# Patient Record
Sex: Female | Born: 1970 | Race: White | Hispanic: No | Marital: Married | Smoking: Never smoker
Health system: Southern US, Community
[De-identification: ages and names within clinical notes are randomized; demographics above are authoritative.]

## PROBLEM LIST (undated history)

## (undated) DIAGNOSIS — E119 Type 2 diabetes mellitus without complications: Secondary | ICD-10-CM

## (undated) DIAGNOSIS — I639 Cerebral infarction, unspecified: Secondary | ICD-10-CM

## (undated) DIAGNOSIS — I10 Essential (primary) hypertension: Secondary | ICD-10-CM

## (undated) DIAGNOSIS — E7800 Pure hypercholesterolemia, unspecified: Secondary | ICD-10-CM

## (undated) DIAGNOSIS — F0390 Unspecified dementia without behavioral disturbance: Secondary | ICD-10-CM

## (undated) DIAGNOSIS — E079 Disorder of thyroid, unspecified: Secondary | ICD-10-CM

## (undated) HISTORY — PX: APPENDECTOMY: SHX54

---

## 1998-09-12 ENCOUNTER — Other Ambulatory Visit: Admission: RE | Admit: 1998-09-12 | Discharge: 1998-09-12 | Payer: Self-pay | Admitting: Obstetrics and Gynecology

## 1998-12-27 ENCOUNTER — Encounter: Admission: RE | Admit: 1998-12-27 | Discharge: 1999-03-27 | Payer: Self-pay | Admitting: Endocrinology

## 2000-03-04 ENCOUNTER — Other Ambulatory Visit: Admission: RE | Admit: 2000-03-04 | Discharge: 2000-03-04 | Payer: Self-pay | Admitting: Obstetrics and Gynecology

## 2000-10-01 ENCOUNTER — Other Ambulatory Visit: Admission: RE | Admit: 2000-10-01 | Discharge: 2000-10-01 | Payer: Self-pay | Admitting: Obstetrics and Gynecology

## 2001-03-02 ENCOUNTER — Emergency Department (HOSPITAL_COMMUNITY): Admission: EM | Admit: 2001-03-02 | Discharge: 2001-03-02 | Payer: Self-pay | Admitting: Emergency Medicine

## 2001-04-22 ENCOUNTER — Other Ambulatory Visit: Admission: RE | Admit: 2001-04-22 | Discharge: 2001-04-22 | Payer: Self-pay | Admitting: Obstetrics and Gynecology

## 2001-08-12 ENCOUNTER — Ambulatory Visit (HOSPITAL_COMMUNITY): Admission: RE | Admit: 2001-08-12 | Discharge: 2001-08-13 | Payer: Self-pay | Admitting: Ophthalmology

## 2001-08-12 ENCOUNTER — Encounter: Payer: Self-pay | Admitting: Ophthalmology

## 2002-06-01 ENCOUNTER — Other Ambulatory Visit: Admission: RE | Admit: 2002-06-01 | Discharge: 2002-06-01 | Payer: Self-pay | Admitting: Obstetrics and Gynecology

## 2003-09-20 ENCOUNTER — Inpatient Hospital Stay (HOSPITAL_COMMUNITY): Admission: EM | Admit: 2003-09-20 | Discharge: 2003-09-23 | Payer: Self-pay | Admitting: Emergency Medicine

## 2003-12-01 ENCOUNTER — Other Ambulatory Visit: Admission: RE | Admit: 2003-12-01 | Discharge: 2003-12-01 | Payer: Self-pay | Admitting: Obstetrics and Gynecology

## 2004-08-03 ENCOUNTER — Inpatient Hospital Stay (HOSPITAL_COMMUNITY): Admission: EM | Admit: 2004-08-03 | Discharge: 2004-08-05 | Payer: Self-pay | Admitting: *Deleted

## 2004-11-28 ENCOUNTER — Encounter: Admission: RE | Admit: 2004-11-28 | Discharge: 2004-11-28 | Payer: Self-pay | Admitting: Gastroenterology

## 2005-05-08 ENCOUNTER — Ambulatory Visit (HOSPITAL_COMMUNITY): Admission: RE | Admit: 2005-05-08 | Discharge: 2005-05-09 | Payer: Self-pay | Admitting: Ophthalmology

## 2009-02-18 ENCOUNTER — Inpatient Hospital Stay (HOSPITAL_COMMUNITY): Admission: AD | Admit: 2009-02-18 | Discharge: 2009-02-18 | Payer: Self-pay | Admitting: Obstetrics

## 2009-02-20 ENCOUNTER — Encounter (INDEPENDENT_AMBULATORY_CARE_PROVIDER_SITE_OTHER): Payer: Self-pay | Admitting: Obstetrics and Gynecology

## 2009-02-20 ENCOUNTER — Ambulatory Visit (HOSPITAL_COMMUNITY): Admission: AD | Admit: 2009-02-20 | Discharge: 2009-02-21 | Payer: Self-pay | Admitting: Obstetrics and Gynecology

## 2010-06-14 ENCOUNTER — Encounter: Admission: RE | Admit: 2010-06-14 | Discharge: 2010-07-04 | Payer: Self-pay | Admitting: Obstetrics and Gynecology

## 2010-07-03 ENCOUNTER — Ambulatory Visit (HOSPITAL_COMMUNITY): Admission: RE | Admit: 2010-07-03 | Discharge: 2010-07-03 | Payer: Self-pay | Admitting: Obstetrics and Gynecology

## 2010-07-17 ENCOUNTER — Ambulatory Visit (HOSPITAL_COMMUNITY): Admission: RE | Admit: 2010-07-17 | Discharge: 2010-07-17 | Payer: Self-pay | Admitting: Obstetrics and Gynecology

## 2010-07-31 ENCOUNTER — Ambulatory Visit (HOSPITAL_COMMUNITY): Admission: RE | Admit: 2010-07-31 | Discharge: 2010-07-31 | Payer: Self-pay | Admitting: Obstetrics and Gynecology

## 2010-08-05 ENCOUNTER — Ambulatory Visit (HOSPITAL_COMMUNITY): Admission: RE | Admit: 2010-08-05 | Discharge: 2010-08-05 | Payer: Self-pay | Admitting: Obstetrics and Gynecology

## 2010-08-07 ENCOUNTER — Ambulatory Visit (HOSPITAL_COMMUNITY): Admission: RE | Admit: 2010-08-07 | Discharge: 2010-08-07 | Payer: Self-pay | Admitting: Obstetrics and Gynecology

## 2010-12-17 LAB — GLUCOSE, CAPILLARY
Glucose-Capillary: 107 mg/dL — ABNORMAL HIGH (ref 70–99)
Glucose-Capillary: 148 mg/dL — ABNORMAL HIGH (ref 70–99)
Glucose-Capillary: 49 mg/dL — ABNORMAL LOW (ref 70–99)

## 2010-12-17 LAB — CBC
MCH: 29.6 pg (ref 26.0–34.0)
MCHC: 34.8 g/dL (ref 30.0–36.0)
MCV: 84.9 fL (ref 78.0–100.0)
Platelets: 267 10*3/uL (ref 150–400)

## 2010-12-17 LAB — TYPE AND SCREEN: Antibody Screen: NEGATIVE

## 2010-12-17 LAB — COMPREHENSIVE METABOLIC PANEL
AST: 46 U/L — ABNORMAL HIGH (ref 0–37)
Albumin: 2.3 g/dL — ABNORMAL LOW (ref 3.5–5.2)
BUN: 7 mg/dL (ref 6–23)
Calcium: 8.6 mg/dL (ref 8.4–10.5)
Creatinine, Ser: 0.65 mg/dL (ref 0.4–1.2)
GFR calc Af Amer: >60 mL/min (ref >60)
GFR calc non Af Amer: >60 mL/min (ref >60)

## 2010-12-17 LAB — TISSUE HYBRIDIZATION TO NCBH

## 2011-01-14 LAB — CBC
HCT: 19.2 % — ABNORMAL LOW (ref 36.0–46.0)
HCT: 19.8 % — ABNORMAL LOW (ref 36.0–46.0)
HCT: 26.8 % — ABNORMAL LOW (ref 36.0–46.0)
HCT: 27.6 % — ABNORMAL LOW (ref 36.0–46.0)
Hemoglobin: 6.6 g/dL — CL (ref 12.0–15.0)
Hemoglobin: 9.6 g/dL — ABNORMAL LOW (ref 12.0–15.0)
MCHC: 34.4 g/dL (ref 30.0–36.0)
MCHC: 34.8 g/dL (ref 30.0–36.0)
MCHC: 35.8 g/dL (ref 30.0–36.0)
MCV: 83.5 fL (ref 78.0–100.0)
MCV: 84.7 fL (ref 78.0–100.0)
Platelets: 244 10*3/uL (ref 150–400)
Platelets: 283 10*3/uL (ref 150–400)
RBC: 2.3 MIL/uL — ABNORMAL LOW (ref 3.87–5.11)
RBC: 3.31 MIL/uL — ABNORMAL LOW (ref 3.87–5.11)
RDW: 14.8 % (ref 11.5–15.5)
RDW: 15.4 % (ref 11.5–15.5)
RDW: 15.4 % (ref 11.5–15.5)
WBC: 20.6 10*3/uL — ABNORMAL HIGH (ref 4.0–10.5)
WBC: 26 10*3/uL — ABNORMAL HIGH (ref 4.0–10.5)

## 2011-01-14 LAB — GLUCOSE, CAPILLARY
Glucose-Capillary: 22 mg/dL — CL (ref 70–99)
Glucose-Capillary: 259 mg/dL — ABNORMAL HIGH (ref 70–99)
Glucose-Capillary: 261 mg/dL — ABNORMAL HIGH (ref 70–99)
Glucose-Capillary: 31 mg/dL — CL (ref 70–99)
Glucose-Capillary: 32 mg/dL — CL (ref 70–99)
Glucose-Capillary: 330 mg/dL — ABNORMAL HIGH (ref 70–99)
Glucose-Capillary: 35 mg/dL — CL (ref 70–99)
Glucose-Capillary: 35 mg/dL — CL (ref 70–99)
Glucose-Capillary: 35 mg/dL — CL (ref 70–99)
Glucose-Capillary: 43 mg/dL — ABNORMAL LOW (ref 70–99)
Glucose-Capillary: 52 mg/dL — ABNORMAL LOW (ref 70–99)
Glucose-Capillary: 59 mg/dL — ABNORMAL LOW (ref 70–99)
Glucose-Capillary: 75 mg/dL (ref 70–99)
Glucose-Capillary: 78 mg/dL (ref 70–99)

## 2011-01-14 LAB — COMPREHENSIVE METABOLIC PANEL
ALT: 13 U/L (ref 0–35)
ALT: 16 U/L (ref 0–35)
AST: 16 U/L (ref 0–37)
Albumin: 1.5 g/dL — ABNORMAL LOW (ref 3.5–5.2)
Alkaline Phosphatase: 94 U/L (ref 39–117)
Alkaline Phosphatase: 96 U/L (ref 39–117)
BUN: 11 mg/dL (ref 6–23)
BUN: 9 mg/dL (ref 6–23)
CO2: 19 mEq/L (ref 19–32)
Chloride: 106 mEq/L (ref 96–112)
GFR calc Af Amer: >60 mL/min (ref >60)
GFR calc non Af Amer: 59 mL/min — ABNORMAL LOW (ref >60)
Glucose, Bld: 275 mg/dL — ABNORMAL HIGH (ref 70–99)
Potassium: 3.9 mEq/L (ref 3.5–5.1)
Potassium: 4.2 mEq/L (ref 3.5–5.1)
Sodium: 133 mEq/L — ABNORMAL LOW (ref 135–145)
Sodium: 135 mEq/L (ref 135–145)
Total Bilirubin: 0.6 mg/dL (ref 0.3–1.2)
Total Protein: 3.5 g/dL — ABNORMAL LOW (ref 6.0–8.3)
Total Protein: 4.3 g/dL — ABNORMAL LOW (ref 6.0–8.3)

## 2011-01-14 LAB — GLUCOSE, RANDOM: Glucose, Bld: 28 mg/dL — CL (ref 70–99)

## 2011-01-14 LAB — CROSSMATCH

## 2011-01-14 LAB — PREPARE RBC (CROSSMATCH)

## 2011-01-14 LAB — ABO/RH: ABO/RH(D): O POS

## 2011-02-21 NOTE — H&P (Signed)
NAMESHERECE, GAMBRILL NO.:  [id number]   MEDICAL RECORD NO.:  [id number]                   PATIENT TYPE:  INP   LOCATION:  0162                                 FACILITY:  [facility]   PHYSICIAN:  Tera Mater. Evlyn Kanner, M.D.              DATE OF BIRTH:  May 28, 1971   DATE OF ADMISSION:  09/20/2003  DATE OF DISCHARGE:  09/23/2003                                HISTORY & PHYSICAL   Ms. [name] is a 40 year old white female with longstanding type 1 diabetes,  multiple complications, as well as hypertension.  She has been ill since the  day prior to presentation with multiple episode of nausea and vomiting but  no abdominal pain or diarrhea.  Her muscles are now sore from vomiting, but  she has no myalgias of other types.  Her bowels have not moved at all in  about 36 hours.  She has had no headache, dizziness, or discrete neurologic  complaints.  Specifically has had no photophobia or stiff neck.  She ate a  little bit on the day prior to admission but has really had no p.o. intake  since.  Her weight is slightly down with this illness.  She has had no  pulmonary symptoms, no cough, no congestion, no nasal symptoms or sinus  symptoms.  She has had no documented fevers, chills, or sweats.  She has had  no urinary symptoms, dysuria, or hesitancy.  She has had no unusual travel  or exposures.  She is still a bit nauseous at presentation to the emergency  room.  She has been watching her blood sugars, and they have actually done  fairly well.  She has had no blood sugars in excess of 250.  She has had  some minor hypoglycemia.   PAST MEDICAL HISTORY:  1. Type 1 diabetes dating back to the age of 28.  2. Prior retinopathy with vitrectomy.  3. History of nephropathy.  4. Neuropathy.  5. Two episodes of DKA in 1997 and 1998.  6. 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  7. History of hyperlipidemia.  8. History of dysfunctional uterine bleeding.  9.  Appendectomy.  10.      C-section.   SOCIAL HISTORY:  She is married, with 1 natural child and 2 adopted  children.   MEDICATIONS:  1. Lantus 32 at bed.  2. Sliding scale Humalog.  3. Altace 5.  4. Hyzaar 100/25.  5. Oral contraceptives.  6. Lipitor.  7. Lasix.   ALLERGIES:  BACTRIM and CODEINE.   PHYSICAL EXAM:  GENERAL:  Ill-appearing.  VITAL SIGNS:  Blood pressure 116/74, pulse 124 and regular, in a sinus  tachycardia on the monitor, respirations 18, temperature initially 97.1,  rising to 100.3 in the ER, O2 saturations 99%.  HEENT:  Sclerae anicteric.  Extraocular movements are intact.  There is no  nystagmus.  No lid lag is  present.  Oral membranes are still moist.  No  oropharyngeal lesions are present.  Nasopharynx is clear.  Tympanic  membranes are clear.  NECK:  Entirely supple in all directions.  No bruits or JVD are present.  LUNGS:  Clear, without wheezes, rales, or rhonchi.  No Kussmaul pattern is  present.  No accessory muscles in use.  HEART:  Tachycardic and regular, with a soft flow murmur.  No S3 is heard.  ABDOMEN:  Slightly distended, with hypoactive bowel sounds.  No discrete  areas of tenderness.  No masses.  No pulsations are noted.  EXTREMITIES:  Pulses intact with trace of edema.  NEUROLOGIC:  Awake, alert, mentating well.  Speech is clear.  No tremors  present.  SKIN:  No bronzing or pigmentary changes.   White count is 13,900, hemoglobin 13.5, platelets 467,000.  Sodium 133,  potassium 3.8, chloride 95, CO2 19, BUN 41, creatinine 1.7, glucose 233.   In summary, I have a 40 year old white female with known type 1 diabetes  presenting with intractable nausea and vomiting.  There is no evidence of  diabetic ketoacidosis by laboratory or clinical criteria.  Her abdominal  exam is largely unremarkable and does not suggest obstruction or severe  intra-abdominal catastrophe.  Liver function tests do need to be checked.  Blood sugars are actually  dropping towards the lower side, and even though  there are no other supporting evidence of cortisol deficiency we do need to  be concerned about this.  Her sodium, potassium, and blood pressure all are  fine, but the morning cortisol will be checked anyway.  I do not think  cortisol coverage is needed this evening.  With the minor increase in fever  and nausea and vomiting, we will cover with some antibiotics while awaiting  cultures.  She will get hydration, bowel rest, and sliding scale insulin.  Clinically she is not thyrotoxic, but this will be checked as well.  GI will  be reconsulted about her clinical condition.  Serial laboratories will be  checked.                                               Tera Mater. Evlyn Kanner, M.D.    SAS/MEDQ  D:  10/25/2003  T:  10/25/2003  Job:  [id number]

## 2011-02-21 NOTE — Op Note (Signed)
NAMEBRESHAY, ILG               ACCOUNT NO.:  [id number]   MEDICAL RECORD NO.:  [id number]          PATIENT TYPE:  OIB   LOCATION:  2550                         FACILITY:  [facility]   PHYSICIAN:  Beulah Gandy. Ashley Royalty, M.D. DATE OF BIRTH:  [birth year]   DATE OF PROCEDURE:  05/08/2005  DATE OF DISCHARGE:                                 OPERATIVE REPORT   ADMISSION DIAGNOSIS:  Traction retinal detachment, proliferative diabetic  retinopathy, vitreous hemorrhage, right eye.   PROCEDURES:  Pars plana vitrectomy, retinal photocoagulation, gas fluid  exchange, intravitreal Kenalog injection, membrane peel, in the right eye.   SURGEON:  Alan Mulder, M.D.   ASSISTANT:  Rosalie Doctor, MA   ANESTHESIA:  General.   DETAILS:  Usual prep and drape, 25 gauge trocars placed at 8, 10, and 2  o'clock, infusion at 8 o'clock. Provisc placed on the corneal surface. The  pars plana vitrectomy was begun just behind the crystalline lens.  Blood and  vitreous were encountered. These were carefully removed under low suction  and rapid cutting. The vitrectomy was carried down to the macular surface  where membranes extended from the disk superiorly and nasally. These were  engaged with the lighted pick and peeled from their attachments to the  retina. They were trimmed down with the vitreous cutter and cauterized with  the Endo cautery and Endo diathermy.  Additional vitrectomy was carried out  to the vitreous base and scleral depression was used.  Once all vitreous and  traction was removed, the endolaser was positioned in the eye, 878 burns  placed around the retinal periphery with a power of 1000 milliwatts, 1000  microns each and 0.1 seconds each.  Intravitreal Kenalog was injected 0.1 mL  over the macula. A gas fluid exchange approximately 20% was performed. The  trocars were removed from the eye carefully and each wound was tested and  found to be tight. The conjunctiva was rinsed with polymyxin and  gentamicin.  Marcaine was injected around the globe for postop pain, Decadron 10  milligrams was injected to the lower subconjunctival space. Tobradex  ophthalmic ointment, a patch and shield were placed. The closing pressure  was 10 with a Barraquer tonometer.   COMPLICATIONS:  None.   DURATION:  1 hour.   A patch and shield were placed. The patient was awakened, taken to recovery  in satisfactory condition.      JDM/MEDQ  D:  05/08/2005  T:  05/09/2005  Job:  [id number]

## 2011-02-21 NOTE — Discharge Summary (Signed)
NAMECHELAN, HERINGER NO.:  [id number]   MEDICAL RECORD NO.:  [id number]          PATIENT TYPE:  INP   LOCATION:  5007                         FACILITY:  [facility]   PHYSICIAN:  Tera Mater. Evlyn Kanner, M.D. DATE OF BIRTH:  1971/02/18   DATE OF ADMISSION:  08/03/2004  DATE OF DISCHARGE:  08/05/2004                                 DISCHARGE SUMMARY   DISCHARGE DIAGNOSES:  1.  Diabetic ketoacidosis, clinically resolved.  2.  Probable Streptococcus pharyngitis, clinically improved.  3.  Renal insufficiency, resolved.  4.  Hyperlipidemia, stable.  5.  Hypertension, stable.  6.  Depression and neuropathy, stable.   HISTORY OF PRESENT ILLNESS:  Ms. Girtha Rm is a 40 year old, white female with  history of known type 1 since 1977, who presented for evaluation in the  afternoon of August 03, 2004.  She was there with nausea, vomiting and sore  throat and had a mixed picture of metabolic acidosis and diabetic  ketoacidosis.  She was treated with IV insulin via Glucommander, IV fluids  and covered with IV antibiotics.  The patient is now much improved.  She has  been transitioned back to her usual insulin regimen and blood sugar this  morning is 111, CO2 has come up from a low value to normal value at 24 and  creatinine has resolved back down to 0.7.  She has been eating and drinking  well with no nausea or vomiting.  She has had no diarrhea and her bowels are  working well.  Her only remaining symptom is of some mild cough that is  nonproductive.  She has had no fever and her blood pressure is normal.  It  appears that her ketoacidosis has resolved and she is discharged home now in  improved condition.   LABORATORY DATA AND X-RAY FINDINGS:  Laboratory data from this morning with  white count 6700, hemoglobin 10.4, MCV 86.8, platelets 281,000.  Her sodium  this morning is 140, potassium 3.8, chloride 111, CO2 24, BUN 4, creatinine  0.7, glucose 111, calcium 7.7.  Earlier  laboratories at presentation with  sodium 140, potassium 4.4, chloride 100, CO2 9, BUN 31, creatinine 2.3,  glucose 265, calcium 9.7.  Total protein 7.0, albumin 3.3, AST 17, ALT 14,  Alk phos 92, total bilirubin 2.2.  Lipase 29.  Initial white count 12,500,  hemoglobin 13.5, platelets 391,000.  Her electrolytes have remained in good  levels with no significant hypokalemia.  She has had no trouble with her  sodium.  She has overall done extremely well and as noted had resolution.  Her liver function testing that was basically normal except for total  bilirubin came back with a total bilirubin of 0.6.  Serum pregnancy test was  negative.  At presentation, the urinalysis did show large bilirubin greater  than 80% ketones and 100 mg protein.  No infection was noted in that.   EKG was sinus rhythm with some tachycardia, but otherwise unremarkable.  There was an acute abdominal series and chest that were both benign without  significant problems.   HOSPITAL COURSE:  We have a  40 year old, white female with known type 1  diabetes presenting now with diabetic ketoacidosis probably secondary to  pharyngitis.  She is markedly improved and discharged home with  instructions.   DIET:  Follow as before.   SPECIAL INSTRUCTIONS:  No pain management is necessary.   DISCHARGE MEDICATIONS:  1.  Lantus and Novolog as before with 32 units of Lantus.  2.  Cymbalta 60 daily.  3.  Altace 10 daily.  4.  Hyzaar 100/25 daily.  5.  Vytorin 10/80 daily.  6.  Lasix 40 mg daily.  7.  Complete Keflex 500 mg three times a day for an additional 5 days.   FOLLOW UP:  She is to see me in 2-3 weeks.  She is to call if she has  recurrent nausea and vomiting, sugars go up or she has other problems.       SAS/MEDQ  D:  08/05/2004  T:  08/05/2004  Job:  [id number]

## 2011-02-21 NOTE — Discharge Summary (Signed)
NAMEBARBARANN, KELLY NO.:  [id number]   MEDICAL RECORD NO.:  [id number]                   PATIENT TYPE:  INP   LOCATION:  0162                                 FACILITY:  [facility]   PHYSICIAN:  Tera Mater. Evlyn Kanner, M.D.              DATE OF BIRTH:  [birth year]   DATE OF ADMISSION:  09/20/2003  DATE OF DISCHARGE:  09/23/2003                                 DISCHARGE SUMMARY   DISCHARGE DIAGNOSES:  1. Severe diabetic ketoacidosis that developed while in hospital.  2. Intractable nausea and vomiting, resolved.  3. Known type 1 diabetes.  4. Anemia.  5. Nephropathy.   HOSPITAL COURSE:  Ms. Girtha Rm is a 40 year old white female with longstanding  type 1 diabetes dating back to the age of 17 with multiple complications.  She was admitted with nausea and vomiting illness and not able to keep any  food down on June 21, 2003.  At that point there was no evidence of  ketosis, acidosis, or severe diabetic disarray.  Over the first night and  evening, she was ordered to get both Lantus and Humalog. Both are charted as  being given. Unfortunately, however, while in the hospital she went into  very severe DKA with a basically unmeasurable bicarbonate and very high  blood sugars. This is an extremely unusual finding and seems to indicate  missed or inadequate insulin during her hospital time. This is not easy to  document, however.   The patient was ill enough that she was transferred to the intensive care  unit. She got vigorous IV hydration, IV insulin, and several hours later  this had resolved. Her nausea and vomiting obviously worsened with this  problem. Her liver function tests became abnormal and she became clinically  unstable.  On the 17th her diet was slowly reinitiated and the patient made  a good clinical improvement and discharged home in improved condition on  September 23, 2003.   She was covered for infection, although none ever showed up. There  was no  evidence of an acute abdominal event causing the acute acidosis. All  evidence points to an active or insufficient insulin effect during her  hospital time. She was seen in consultation by GI during this  hospitalization and they did do an ultrasound which was unremarkable.   Laboratory data on the 16th while in ketoacidosis, pH dropped to 7.252, PCO2  only 11, PO2 of 125, and bicarbonate 4.7. Initial white count was 13,900 and  rose to 16,500, and back to 7000. Hemoglobin was 13.5 and dropped to 10.6  with hydration. Platelet counts were 462,000 and dropped down to 259,000.  Initial chemistry revealed sodium of 132, potassium 2.8, chloride 95, CO2  19, BUN 41, creatinine 1.7, glucose 233, calcium 10.0. At 4:40 in the  morning, on September 21, 2003, her numbers did deteriorate to a sodium of  137, potassium 5.9, chloride 107, CO2  5, BUN 25, creatinine 1.5, glucose  501.  An albumin was 3.2 with the IV drip. She did have a slightly low  potassium at 3.2 on the 17th. Her chemistries at last check on the 18th  revealed sodium of 138, potassium 3.7, chloride 115, CO2 21, BUN 4,  creatinine 0.6, glucose 95, calcium 7.1.  Liver function testing was normal.  AST and ALT at 20 and 18.  Alkaline phosphatase was up at 127 with repeat of  128. Total bilirubin was up at 2.1, repeat at 2.3, and indirect bilirubin  2.0.  Lactic acid was 3.1 on the 16th, moderate serum ketones noted. TSH was  1.120. In the afternoon of 12:15, the cortisol was 18.7. Urinalysis on the  15th showed 80 mg% ketones, but negative glucose. Blood cultures times two  negative. Electrocardiogram shows sinus rhythm with slightly long QT, but  otherwise unremarkable.  Abdominal ultrasound was done that showed  hepatomegaly. This report is not available for my evaluation.   COMMENTS:  In summary, we have a 40 year old white female with known type 1  diabetes presenting with nausea, vomiting, and illness appears to be  fairly  self-resolving. During her hospital time she developed very severe diabetic  ketoacidosis with basically unmeasurable bicarbonate, blood sugar greater  than 500 requiring all the appropriate treatment as noted above. The patient  fortunately did quite well with all these routine measures.  The exact  etiology of the severe diabetic ketoacidosis is uncertain, but seems to be  due to insufficient or ineffective insulin action. Fortunately, the patient  did well and was discharged home in improved condition. She is to resume her  Lantus and Humalog as before, Hyzaar, Altace, Lipitor, and her oral  contraceptive. Her Lasix was to be resumed as well after 24 hours. She was  also added potassium 10 mEq daily until she saw me in one week.   DIET:  Diabetic as before.   FOLLOW UP:  Further lab testing will be done when she returns. There is no  evidence of cortisol insufficiency or thyroid dysfunction as part of this  deterioration.                                               Tera Mater. Evlyn Kanner, M.D.    SAS/MEDQ  D:  10/25/2003  T:  10/25/2003  Job:  [id number]

## 2011-02-21 NOTE — H&P (Signed)
[name], [name] NO.:  [id number]   MEDICAL RECORD NO.:  [id number]          PATIENT TYPE:  EMS   LOCATION:  MAJO                         FACILITY:  [facility]   PHYSICIAN:  Barry Dienes. Eloise Harman, M.D.DATE OF BIRTH:  09/22/1971   DATE OF ADMISSION:  08/03/2004  DATE OF DISCHARGE:                                HISTORY & PHYSICAL   CHIEF COMPLAINT:  Nausea, vomiting, and sore throat.   HISTORY OF PRESENT ILLNESS:  The patient is a 40 year old white female with  a 1-1/2 day history of sore throat with nausea and multiple episodes of  green to yellow vomiting since yesterday afternoon.  Her last meal was toast  on October 28th in the morning.  She has continued to take her insulin  despite not eating, and her last dose of insulin was last evening.  This was  approximately 12 hours ago.  In the emergency room, she has been given IV  fluids, Phenergan, and Zofran.  Currently, she complains of nausea,  headache, and a sore throat.   PAST MEDICAL HISTORY:  Diabetes mellitus, type 1, since age 54 years, which  has been complicated by retinopathy, hypertension, hyperlipidemia, and  depression.   MEDICATIONS PRIOR TO ADMISSION:  1.  Lantus insulin, 32 units subcu q.h.s.  2.  Novolog 30 units subcutaneous q.a.m.  She reports that she does not take      Novolog at other times during the day.  3.  Cymbalta 60 mg p.o. daily.  4.  Altace 10 mg p.o. daily.  5.  Hyzaar 100/25 one tab p.o. daily.  6.  Vytorin 10/80 one tab p.o. daily.  7.  Lasix 40 mg p.o. daily.   ALLERGIES:  1.  CODEINE.  2.  SULFA DRUGS.   PAST SURGICAL HISTORY:  Cesarean section.   FAMILY HISTORY:  No close family members with diabetes mellitus, colon  cancer, or breast cancer.   SOCIAL HISTORY:  She is married and has two children.  She has no history of  tobacco or alcohol abuse.   REVIEW OF SYSTEMS:  Significant for having bifrontal headaches with tactile  temperatures over the past two days.   She has also had a moderately severe  sore throat.  She denies stiff neck and denies shortness of breath,  productive cough, or chest pain.  She has had nausea and vomiting for the  past two days, but denies constipation, dysuria, or frequency.   INITIAL PHYSICAL EXAMINATION:  VITAL SIGNS:  Blood pressure 135/49, pulse  98, respirations 18, temperature 98, pulse oxygen saturation 100% on room  air.  GENERAL:  She is a mildly overweight, white female, who had nausea and  looked quite fatigued.  SKIN:  Flushed.  HEENT: Significant for a very dry throat with erythema with in the posterior  oropharynx.  Ear canals and tympanic membranes were normal.  NECK:  Supple and without jugular venous distention, carotid bruit, or  cervical lymphadenopathy.  CHEST:  Clear to auscultation.  HEART:  Regular rate and rhythm without murmur, gallop, or rub.  ABDOMEN:  Normal bowel sounds with no  hepatosplenomegaly or tenderness.  EXTREMITIES:  Without cyanosis, clubbing, or edema.  NEUROLOGIC:  Exam is nonfocal, and she has intact light touch sensation in  her feet.   LABORATORY STUDIES:  White blood cell count 12.6, hemoglobin 13, hematocrit  39, platelet count 391, serum sodium 140, potassium 4.4, chloride 100, CO2  9, BUN 31, creatinine 2.3, glucose 265, total protein 7, albumin 3.3,  urine  pregnancy test negative, alkaline phosphatase 92, total bilirubin 2.2.  Venous blood gas:  pH 7.39, total CO2 15.  Urine ketones greater than 80,  nitrite negative, bacteria rare.   IMPRESSION/PLAN:  1.  Diabetic ketoacidosis:  This is moderately severe.  Her pH is in the      normal range because she has confined metabolic acidosis from diabetic      ketoacidosis and metabolic alkalosis from severe vomiting.  I plan on      continuing intravenous fluid rehydration and placing her on intravenous      insulin via the computerized Glucommander.  2.  Sore throat:  It is likely secondary to strep pharyngitis and  may be the      factor that led her to develop diabetic ketoacidosis.  I plan to give      her a dose of Rocephin today and initiate treatment with Keflex when her      sore throat is better and she is eating better.  3.  Elevated serum creatinine:  It is unclear if this is her baseline serum      creatinine level due to chronic renal insufficiency or if it is elevated      today due to dehydration from her nausea and vomiting.  I plan to      continue intravenous fluids and check serial BMET tests.  We will review      office notes when they are available.       DGP/MEDQ  D:  08/03/2004  T:  08/03/2004  Job:  [id number]   cc:   Jeannett Senior A. Evlyn Kanner, M.D.  [address]  [place]  [place] [address]  Fax: [phone]

## 2011-02-21 NOTE — Consult Note (Signed)
[name], [name] NO.:  [id number]   MEDICAL RECORD NO.:  [id number]                   PATIENT TYPE:  INP   LOCATION:  0375                                 FACILITY:  [facility]   PHYSICIAN:  Bernette Redbird, M.D.                DATE OF BIRTH:  Nov 10, 1970   DATE OF CONSULTATION:  09/21/2003  DATE OF DISCHARGE:                                   CONSULTATION   REASON FOR CONSULTATION:  Dr. Evlyn Kanner asked me to see this 40 year old female  because of protracted nausea and vomiting.   [name] was admitted to the hospital about 12 hours ago with a roughly 24  hour history of recurrent nonbloody emesis with associated nausea.  The  patient has previously had nausea and vomiting in association with episodes  of DKA but DKA was not an issue on the current admission. She has not had  abdominal pain.  She has not had fevers or chills.   The patient recalls a similar episode several years ago which was treated  with intravenous fluids and did not require hospitalization.  The current  symptoms began after lunch two days ago while at work but she does not  suspect any food poisoning.   The patient has longstanding diabetes, basically since age 63, and has  multiple end-organ complications including neuropathy, neuropathy etc. but  up to the present time there has never been clinically-evident GI autonomic  neuropathy.  She does not have symptoms of chronic gastroparesis such as  bloating or food intolerance.   PAST MEDICAL HISTORY:  Allergies to CODEINE and SULFA.   OUTPATIENT MEDICATIONS:  1. Altace.  2. Birth control pills.  3. Lasix.  4. Lipitor.  5. Hyzaar.  6. Lantus.  7. Novolog insulin.   OPERATION:  Remote appendectomy and cesarean section.   CHRONIC MEDICAL ILLNESSES:  1. Severe type 1 diabetes of 25 years duration.  2. Hypertension.  3. Hypercholesterolemia.  4. No known MI or pulmonary disease.   FAMILY HISTORY:  Not obtained in detail, not  felt relevant.   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   REVIEW OF SYMPTOMS:  See above.  Really no chronic symptoms such as reflux,  abdominal pain, or bowel problems such as constipation or diarrhea.   HABITS:  Nonsmoker, nondrinker.   PHYSICAL EXAMINATION:  VITAL SIGNS:  On admission, the patient had a  temperature of 101.1 and white count was slightly elevated at 13,900.  GENERAL:  A pleasant, overweight, rather quiet Caucasian female in no  evident distress. At the time of my evaluation, she is perhaps slightly  somnolent from medication.  HEENT:  Anicteric, no frank pallor.  CHEST:  Clear.  HEART:  Has a 2/6 systolic ejection murmur at the upper left sternal border.  ABDOMEN:  At this time is quiet but benign, soft, without organomegaly,  guarding, mass, tenderness or _________.  LABORATORY DATA:  Urine pregnancy test negative, TSH normal.  Liver  chemistries show mild unconjugated hyperbilirubinemia with a total bilirubin  of 2.1, of which 2.0 is indirect.  The alkaline phosphatase minimally  elevated at 127 (normal up to 117).  AST and ALT normal.  Admission white  count 13,900, hemoglobin 13.5, platelets 462,000, 81 polys, 14 lymphs.  Metabolic panel showed a bicarb of 19 with a glucose of 233, BUN 41,  creatinine 1.7 and liver chemistries as noted above.   IMPRESSION:  1. Protracted nausea and vomiting.  2. Admitting low grade fever and mild leukocytosis of uncertain clinical     significance and uncertain relevance to presenting symptoms.  3. Gilbert's syndrome (mild unconjugated bilirubinemia).  4. Longstanding diabetes with multiple end-organ complications.  5. Mild azotemia consistent with prerenal etiology.   RECOMMENDATIONS:  1. Supportive care with IV fluids and symptomatic control of nausea.  2. Follow fever and white count with consideration for antibiotic therapy     depending on the patient's clinical evolution.  3. Send stool for occult  blood.  4. If the patient's symptoms persist over the next 24 hours or so, consider     endoscopic evaluation.  5. Empiric proton pump inhibitor therapy.  6. Consider intravenous Reglan if dysphagia symptoms persist.  7. Consider possible eventual gastric emptying scan to look for evidence of     gastric dysmotility.                                               Bernette Redbird, M.D.    RB/MEDQ  D:  09/21/2003  T:  09/21/2003  Job:  [id number]   cc:   Jeannett Senior A. Evlyn Kanner, M.D.  [address]  [place]  [place] [address]  Fax: [phone]

## 2011-02-21 NOTE — Op Note (Signed)
[facility]  Patient:    [name], [name] Visit Number: [id number] MRN: [id number]          Service Type: DSU Location: [phone] Attending Physician:  Bertrum Sol Dictated by:   Beulah Gandy. Ashley Royalty, M.D. Proc. Date: 08/12/01 Admit Date:  08/12/2001 Discharge Date: 08/13/2001                             Operative Report  DATE OF BIRTH:  [birth year]  PREOPERATIVE DIAGNOSES: 1. Proliferative diabetic retinopathy, right eye. 2. Proliferative diabetic retinopathy, left eye with vitreous    hemorrhage and traction retinal detachment.  POSTOPERATIVE DIAGNOSES: 1. Proliferative diabetic retinopathy, right eye. 2. Proliferative diabetic retinopathy, left eye with vitreous    hemorrhage and traction retinal detachment.  OPERATION: 1. Panretinal photocoagulation right eye. 2. Pars plana vitrectomy with membrane peel and retinal    photocoagulation, left eye.  SURGEON:  Beulah Gandy. Ashley Royalty, M.D.  ASSISTANT:  Winfred Burn, C.O.A.  ANESTHESIA:  General.  DESCRIPTION OF PROCEDURE:  After proper anesthesia was induced, panretinal photocoagulation was performed in the right eye with the indirect ophthalmoscope laser; 1157 burns were placed to a power of 4000 milliwatts and 1000 microns each and 0.1 seconds each.  The attention was then carried to the left eye where usual prep and drape was performed.  Peritomies at 10, 2 and 4 oclock.  A 4 mm infusion port was anchored into place at 4 oclock. The contact lens ring was anchored into place at 6 and 4 oclock.  The right contact lens was placed under layered methycellulose and onto the cornea.  The vitreous lighted pick and the cutter were placed at 10 and 2 oclock respectively.  The vitrectomy was begun just behind the crystalline lens. Blood was encountered and carefully removed under low suction and rapid cutting down to the macular surface.  Neovascularization was attached to the disk,  and this was lifted free with the lighted vitreous pick.  The posterior membranes were peeled.  The MPC forceps were used to remove attachments of the fibrous membrane from the retina.  The vitrectomy was carried out to the far periphery with a 30 degree prismatic lens and down to the vitreous base for 360 degrees.  Once all blood was removed, the Endolaser was placed in the eye and 755 burns were placed around the retinal periphery with a power of 400 and 1000 microns each and 0.1 seconds each.  A washout procedure was performed.  The instruments were removed from the eye and 9-0 nylon was used to close the sclerotomy sites.  The sites were tested with Weck-cel sponge for tightness.  The conjunctiva was closed with wet-field cautery.  Polymixin and gentamicin were irrigated into tenons space.  Atropine solution was applied. Polysporin, a patch and shield were placed.  The closing tension was less than 10 with a Barraquer tonometer.  Complications none.  Duration one hour. Decadron 10 mg was injected into the lower subconjunctival space.  The patient was awakened and taken to recovery in satisfactory condition. Dictated by:   Beulah Gandy. Ashley Royalty, M.D. Attending Physician:  Bertrum Sol DD:  08/12/01 TD:  08/14/01 Job: [id number]

## 2013-12-15 ENCOUNTER — Emergency Department (HOSPITAL_COMMUNITY)
Admission: EM | Admit: 2013-12-15 | Discharge: 2013-12-15 | Discharge status: Against Medical Advice | Payer: Federal, State, Local not specified - PPO | Attending: Emergency Medicine | Admitting: Emergency Medicine

## 2013-12-15 ENCOUNTER — Encounter (HOSPITAL_COMMUNITY): Payer: Self-pay | Admitting: Emergency Medicine

## 2013-12-15 DIAGNOSIS — F0390 Unspecified dementia without behavioral disturbance: Secondary | ICD-10-CM | POA: Insufficient documentation

## 2013-12-15 DIAGNOSIS — M79609 Pain in unspecified limb: Secondary | ICD-10-CM | POA: Insufficient documentation

## 2013-12-15 DIAGNOSIS — M7989 Other specified soft tissue disorders: Secondary | ICD-10-CM | POA: Insufficient documentation

## 2013-12-15 DIAGNOSIS — I10 Essential (primary) hypertension: Secondary | ICD-10-CM | POA: Insufficient documentation

## 2013-12-15 DIAGNOSIS — E119 Type 2 diabetes mellitus without complications: Secondary | ICD-10-CM | POA: Insufficient documentation

## 2013-12-15 HISTORY — DX: Essential (primary) hypertension: I10

## 2013-12-15 HISTORY — DX: Disorder of thyroid, unspecified: E07.9

## 2013-12-15 HISTORY — DX: Unspecified dementia, unspecified severity, without behavioral disturbance, psychotic disturbance, mood disturbance, and anxiety: F03.90

## 2013-12-15 HISTORY — DX: Type 2 diabetes mellitus without complications: E11.9

## 2013-12-15 HISTORY — DX: Cerebral infarction, unspecified: I63.9

## 2013-12-15 HISTORY — DX: Pure hypercholesterolemia, unspecified: E78.00

## 2013-12-15 NOTE — ED Notes (Signed)
Complain of pain and swelling in left hand

## 2015-06-03 ENCOUNTER — Non-Acute Institutional Stay: Payer: Federal, State, Local not specified - PPO | Admitting: Internal Medicine

## 2015-06-03 DIAGNOSIS — E10319 Type 1 diabetes mellitus with unspecified diabetic retinopathy without macular edema: Secondary | ICD-10-CM | POA: Diagnosis not present

## 2015-06-03 DIAGNOSIS — E1040 Type 1 diabetes mellitus with diabetic neuropathy, unspecified: Secondary | ICD-10-CM | POA: Diagnosis not present

## 2015-06-03 DIAGNOSIS — E039 Hypothyroidism, unspecified: Secondary | ICD-10-CM | POA: Diagnosis not present

## 2015-06-03 DIAGNOSIS — F0150 Vascular dementia without behavioral disturbance: Secondary | ICD-10-CM

## 2015-06-06 ENCOUNTER — Non-Acute Institutional Stay (SKILLED_NURSING_FACILITY): Payer: Federal, State, Local not specified - PPO | Admitting: Internal Medicine

## 2015-06-06 DIAGNOSIS — F0150 Vascular dementia without behavioral disturbance: Secondary | ICD-10-CM

## 2015-06-06 DIAGNOSIS — E1021 Type 1 diabetes mellitus with diabetic nephropathy: Secondary | ICD-10-CM | POA: Diagnosis not present

## 2015-06-06 DIAGNOSIS — E039 Hypothyroidism, unspecified: Secondary | ICD-10-CM

## 2015-06-06 DIAGNOSIS — N182 Chronic kidney disease, stage 2 (mild): Secondary | ICD-10-CM | POA: Diagnosis not present

## 2015-06-08 NOTE — Progress Notes (Addendum)
Patient ID: [name], female   DOB: December 28, 1970, 44 y.o.   MRN: [id number]                HISTORY & PHYSICAL  DATE:  06/03/2015       FAC[facility]: [facility]           LEVEL OF CARE:   SNF   CHIEF COMPLAINT:  Transfer to my service in the building at the request, I believe, of the patient's mother.    HISTORY OF PRESENT ILLNESS:  This is a 44 year-old woman who has been in the building since the end of February of this year.  The documentation surrounding this is really not clear.  I am not even totally clear that she actually came from the hospital versus home.    In any case, there is information on the nursing home electronic health record that suggests that she had a "series of strokes" and apparently was in [facility].  There is nothing in the [facility] system on this.    I do note that most of the nursing home's documentation labels this woman as a type 2 diabetic, including a recent consultation with a physician whom I think is an endocrinologist.  It is very clear from looking back through the records that this patient is actually a type 1 diabetic who became a diabetic at age 64 or 16.    Looking through the notes  in the facility, she has been followed by Psychiatry for memory loss and depression.    She had a recent trip to [facility] to see Orthopedics with a sprain of her right knee and she has a soft knee immobilizer.    She also follows with her gynecologist.  Recent notes note urinary and fecal incontinence.    With regards to her diabetes, I do not see a recent BUN and creatinine nor a spot urine for protein.  Records through [facility] suggested she had retinopathy in notes from 2005.  We will need to make sure she follows with Ophthalmology.  Her recent hemoglobin A1c was 7.6.  There is a suggestion that one more recent than that was 7.2, although I do not really see this.  Recent LDL cholesterol was 148.  CBGs from yesterday were 140, 418, 350,  262, not exactly indicative of great control for a type 1 diabetic.    PAST MEDICAL HISTORY/PROBLEM LIST:               Hypertension.    Hyperlipidemia.    Depression.    Type 1 diabetes with retinopathy.    Hypothyroidism.    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                 Norvasc 5 q.d.      Plavix 75 q.d.      Synthroid 137 daily.     Cozaar 100 q.d.     Magnesium oxide 400 daily.    MiraLAX 17 g daily.    Lexapro 20 q.d.      Myrbetriq 25 daily.     Lasix 20 q.d.     Klor-Con 10 mEq twice a day.    Neurontin 300 b.i.d.      Ultram 50 three times daily.    Depo-Provera 150 intramuscularly every 90 days.    Voltaren 1% gel, 2 g to the top of  the left hand at h.s.      Lantus insulin 20 U at night.     Humalog 10 U a.c. breakfast, 5 U a.c. lunch, and 7 U a.c. dinner.  Humalog is to be held if the blood sugar is less than 100.    She also has a sliding scale beginning at 150 on top of the routine Humalog she gets.    SOCIAL HISTORY:   I do not have a lot of information on this.   MARITAL STATUS:  The patient is apparently separated or divorced.                   FUNCTIONAL STATUS:  She has a walker in her room.  I am not sure how functional she is.    ADVANCED DIRECTIVES:   Her mother is her POA.    FAMILY HISTORY:   She has no family history of diabetes, colon cancer, or breast cancer.    REVIEW OF SYSTEMS:     General: Patient states she feels "ok"   HEENT:    The patient denies visual changes or headache.        CHEST/RESPIRATORY:  No shortness of breath.   CARDIAC:  No chest pain.    GI:  No nausea, vomiting, or diarrhea.   GU:  No dysuria, although she may be voiding incontinently.    MUSCULOSKELETAL:  As noted, she has right knee pain which she still says is somewhat painful, but improving.   NEUROLOGICAL:  She does have numbness in her feet.       PSYCHIATRIC:  Mental status:  Denies current depression.  Her mother notes previous diagnosis  ENDOCRINE: I am able to confirm type 1 diabetes via the Patient/Fam[facility] link. No polyuria and no polydipsia   PHYSICAL EXAMINATION:   VITAL SIGNS:     TEMPERATURE:  98.6.   PULSE:  74.    RESPIRATIONS:  18.   BLOOD PRESSURE:  130/72.   02 SATURATIONS:  96%.   GENERAL APPEARANCE:   The patient is not in any distress.     Lying in bed, eating lunch and cookies.    HEENT:   MOUTH/THROAT:    Oral exam is normal.   CHEST/RESPIRATORY:  Clear air entry bilaterally.     CARDIOVASCULAR:   CARDIAC:  Heart sounds are normal.  There are no murmurs.  No carotid bruits.   BREASTS:  Exam deferred.   LYMPHATICS:  None palpable in the cervical, clavicular, or axillary areas.    GASTROINTESTINAL:   ABDOMEN:  Obese.   No masses.     LIVER/SPLEEN/KIDNEY:   No liver, no spleen.  No tenderness.    GENITOURINARY:   BLADDER:  Not overtly distended or tender.  No CVA tenderness.    CIRCULATION:   ARTERIAL:  Extremities:  Peripheral pulses are palpable in the dorsalis pedis.   MUSCULOSKELETAL:    EXTREMITIES:   RIGHT LOWER EXTREMITY:   She has a knee immobilizer on her right leg.    NEUROLOGICAL:   CRANIAL NERVES:  I could not determine her visual fields.  She seems to have episodic left eye ptosis, but she is able to correct this.   MOTOR:  She has mild right pronator drift.    SENSATION/STRENGTH:  She is weak bilaterally in the lower extremities, especially on the right.    DEEP TENDON REFLEXES:  Reflexes are absent on the left, 2+ on the right.  Upgoing toe on the right,  and perhaps a flicker of a right Hoffman's reflex.   PSYCHIATRIC:   MENTAL STATUS:    The patient insisted she has been here for four days.  She told me she was born in [birth year], not [birth year].  Very vague on her history.  I really sense no depression here.    ASSESSMENT/PLAN:        Type 1 diabetes.  It took me some time to verify  this.  It is all over her nursing home records, including some of her physician's notes but not Dr. Waynard Reeds notes, that she is a type 2 diabetic.  This will need to be corrected.    She will need lab work updated to include a BUN and creatinine, spot urine for protein.  She will also need a TSH.    Hypothyroidism.  On replacement.  We will check a TSH on her.    Hypertension.  This appears to be reasonably well controlled.    Cognitive loss, which in some spheres seems fairly significant.  Staff state here that her memory comes and goes and she has better days.  I will try to verify some history here.    Recent sprain of her right knee.  Seen by Orthopedics.    Diabetic retinopathy.  I will need to see when the last time she saw her ophthalmologist was.     CPT CODE: 16109           ADDENDUM:  I have spoken to her mother.   This lady is, indeed, a type 1 diabetic.  She has known retinopathy, but has not followed with an ophthalmologist.  She follows with a Dr. Loleta Chance who is a neurologist in [place], and a Dr. Karen Kitchens who is an endocrinologist.    According to her mother, she has had four strokes.  She came from home to here after she could no longer walk due to diabetic neuropathy and osteoarthritis.  She is able to confirm diabetic neuropathy, MID.

## 2015-06-13 NOTE — Progress Notes (Addendum)
Patient ID: [name], female   DOB: 1971/06/27, 44 y.o.   MRN: [id number]                PROGRESS NOTE  DATE:  06/06/2015        FACILITY: [facility]                    LEVEL OF CARE:   SNF   Acute Visit            CHIEF COMPLAINT:  Review of diabetes, lab work, other issues.      HISTORY OF PRESENT ILLNESS:  This is a patient whose care I assumed very recently.    She is a 44 year-old type 1 diabetic and I have had the facility change their diagnosis on this.  She was diagnosed at age 80 and has been on insulin ever since.   Several episodes of diabetic ketoacidosis in [facility] certainly confirm this diagnosis.   Her last hemoglobin A1c was on 04/05/2015 at 7.6.   For a type 1 diabetic with diabetic neuropathy, stage II chronic renal failure, we probably should be working towards better control.   I have reviewed her CBGs.  She is in the mid 100s fasting on most days, a.c. lunch anywhere from low 200s to mid 400s, a.c. dinner generally in the high 100s to 300, and at h.s. mid 100s to mid 200s on most days.  She has an insulin sliding scale that has been written by her endocrinologist.  She is on Lantus 20 U subcu q.h.s.  As mentioned, I would like some better control here.  However, the patient is seeing her endocrinologist tomorrow morning and, therefore, I will wait for his recommendations.    Other relevance is an LDL of 148, again which is elevated for a diabetic.    LABORATORY DATA:   Lab work from 06/04/2015:    CBC:  White count 8.8, hemoglobin 11.4, platelet count 325.  Differential count is normal.    Basic metabolic panel:  BUN 23, creatinine 1.09, sodium 141, potassium 4.6, total CO2 of 20.    Estimated GFR at 62.      TSH normal at 1.49.     REVIEW OF SYSTEMS:    CHEST/RESPIRATORY:  No complaints of cough.   CARDIAC:  No chest pain.   GI:  No nausea, vomiting, or diarrhea.       NEUROLOGICAL:  She is followed by a Dr. Loleta Chance, who is a neurologist in  [place].  She has known diabetic neuropathy and carpal tunnel syndrome.  Apparently, she does not walk well, a shuffling type gait, although I have not seen her on her feet.  She has a recent sprain of her right knee.  Apparently is going for EMGs and nerve conduction studies.      PHYSICAL EXAMINATION:   GENERAL APPEARANCE:  The patient is not in any distress.    CHEST/RESPIRATORY:  Clear air entry bilaterally.    CARDIOVASCULAR:   CARDIAC:  Heart sounds are normal.  There are no murmurs.    GASTROINTESTINAL:   ABDOMEN:  Nontender.  No masses.     GENITOURINARY:   BLADDER:  Not distended.     CIRCULATION:   EDEMA/VARICOSITIES:  Extremities:  No edema.    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  I believe I have asked for  a spot urine for microalbuminuria and I will wait to see this.  She is on an ARB.  We are awaiting her endocrinologist suggestions on Insulin adjustment.   Hyperlipidemia.  We can probably aim to do better on this with a recent LDL of 148.    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  Apparently, a series of strokes left this woman fairly disabled.  One would think that at the cerebrovascular level, she probably has macrovascular disease.    Hypothyroidism.  On replacement.  This is adequate.

## 2015-07-09 ENCOUNTER — Non-Acute Institutional Stay (SKILLED_NURSING_FACILITY): Payer: Federal, State, Local not specified - PPO | Admitting: Internal Medicine

## 2015-07-09 DIAGNOSIS — E1122 Type 2 diabetes mellitus with diabetic chronic kidney disease: Secondary | ICD-10-CM | POA: Diagnosis not present

## 2015-07-09 DIAGNOSIS — G5602 Carpal tunnel syndrome, left upper limb: Secondary | ICD-10-CM

## 2015-07-09 DIAGNOSIS — I129 Hypertensive chronic kidney disease with stage 1 through stage 4 chronic kidney disease, or unspecified chronic kidney disease: Secondary | ICD-10-CM | POA: Diagnosis not present

## 2015-07-09 DIAGNOSIS — E1021 Type 1 diabetes mellitus with diabetic nephropathy: Secondary | ICD-10-CM | POA: Diagnosis not present

## 2015-07-09 DIAGNOSIS — F0151 Vascular dementia with behavioral disturbance: Secondary | ICD-10-CM | POA: Diagnosis not present

## 2015-07-09 DIAGNOSIS — N183 Chronic kidney disease, stage 3 unspecified: Secondary | ICD-10-CM

## 2015-07-09 DIAGNOSIS — F01518 Vascular dementia, unspecified severity, with other behavioral disturbance: Secondary | ICD-10-CM

## 2015-07-10 NOTE — Progress Notes (Addendum)
Patient ID: [name], female   DOB: Oct 29, 1970, 44 y.o.   MRN: [id number]                PROGRESS NOTE  DATE:  07/09/2015        FACILITY: [facility]                LEVEL OF CARE:   SNF   Acute Visit             CHIEF COMPLAINT:  Follow type 1 diabetes.      HISTORY OF PRESENT ILLNESS:  Mrs. Golonka blood sugars have come to be viewed as a difficult to control situation.   Her hemoglobin A1c on 07/05/2015 was 7.7.   She is on Lantus 20 U in the morning, Humalog 10 U a.c. breakfast, 7 U a.c. lunch and supper.  I think the hemoglobin A1c actually flatters what has been going on recently.  Blood sugars in the morning over the last three days were 86 and 62, and then 369 this morning.  In consultation with her endocrinologist, I reduced the threshold for giving her short-acting insulin from 100 to 90, although she still does not seem to get any a.c. breakfast short-acting insulin as her blood sugar is less than 90.  Most of the day, I do not think she gets enough short-acting insulin.        REVIEW OF SYSTEMS:    CHEST/RESPIRATORY:  No shortness of breath.   CARDIAC:  No chest pain.   GI:  No nausea, vomiting, or diarrhea.              GU:  No dysuria.     PHYSICAL EXAMINATION:   GENERAL APPEARANCE:  The patient looks well.     CHEST/RESPIRATORY:  Clear air entry bilaterally.    CARDIOVASCULAR:   CARDIAC:  Heart sounds are normal.  She appears to be euvolemic.       GASTROINTESTINAL:   ABDOMEN:  No masses.      LIVER/SPLEEN/KIDNEYS:  No liver, no spleen.  No tenderness.    GENITOURINARY:   BLADDER:  No suprapubic or costovertebral angle tenderness.    ASSESSMENT/PLAN:              Poorly controlled type 1 diabetes.   I am going to reduce the Lantus to 17 U qam and try and transition this to more short-acting insulin during the day.   I am going to increase the Humalog to 12 U a.c. breakfast and 10 U a.c. lunch and supper.  The control issue seems to be mostly that  there is not any  Short acting insulin ac breakfast on the vast majority of days.

## 2015-07-11 ENCOUNTER — Non-Acute Institutional Stay (SKILLED_NURSING_FACILITY): Payer: Federal, State, Local not specified - PPO | Admitting: Internal Medicine

## 2015-07-11 DIAGNOSIS — E1021 Type 1 diabetes mellitus with diabetic nephropathy: Secondary | ICD-10-CM | POA: Diagnosis not present

## 2015-07-14 DIAGNOSIS — E1021 Type 1 diabetes mellitus with diabetic nephropathy: Secondary | ICD-10-CM | POA: Insufficient documentation

## 2015-07-14 DIAGNOSIS — F0150 Vascular dementia without behavioral disturbance: Secondary | ICD-10-CM | POA: Insufficient documentation

## 2015-07-14 DIAGNOSIS — E1122 Type 2 diabetes mellitus with diabetic chronic kidney disease: Secondary | ICD-10-CM | POA: Insufficient documentation

## 2015-07-14 DIAGNOSIS — N183 Chronic kidney disease, stage 3 (moderate): Secondary | ICD-10-CM

## 2015-07-14 DIAGNOSIS — G5600 Carpal tunnel syndrome, unspecified upper limb: Secondary | ICD-10-CM | POA: Insufficient documentation

## 2015-07-15 NOTE — Progress Notes (Addendum)
Patient ID: [name], female   DOB: 1971/05/03, 44 y.o.   MRN: [id number]                PROGRESS NOTE  DATE:  07/11/2015          [facility]       LEVEL OF CARE:   SNF   Acute Visit                  HISTORY OF PRESENT ILLNESS:  This is a 44 year-old woman whose care I assumed at the end of August.    At the time, her nursing home documentation labeled this lady as a type 2 diabetic, including a recent consultation with her endocrinologist.  Her notes on [facility] actually make very clear reference that this patient is a type 1 diabetic who became a diabetic at age 2 or 39.  She had had several admissions to [facility] with ketoacidosis.  Her most recent hemoglobin A1c is 7.6, although I think her blood sugars actually are much higher than that and I would not be surprised if her next hemoglobin A1c is higher.    We have had multiple problems related to this.  First of all, I became aware today that the patient is on Bydureon.  I was not aware that she was taking this.  I stumbled on it looking in her MAR.  This is not indicated for type 1 diabetes.  I will discontinue this.    The other issue is that her blood sugar in the morning is always less than 100.  Therefore, according to the orders, she would receive no insulin.  Therefore, her a.c. lunch blood sugars were always in the high 200s and 300s.  I had to correct her Lantus, lower it somewhat, in order that the facility would actually give some insulin before breakfast.  Unfortunately, they misinterpreted my intent of this to change her Lantus to nighttime.  I had no intentions of doing that.  Her Lantus was being given in the morning.    Finally, the patient has routine Humalog a.c. meals and then a sliding scale.  However, the sliding scale is being initiated at 6 o'clock in the morning and then the regular Humalog before she eats breakfast.  I really tried to correct this, as well.    PHYSICAL EXAMINATION:    CHEST/RESPIRATORY:  Clear air entry bilaterally.    CARDIOVASCULAR:   CARDIAC:  Heart sounds are normal.   GASTROINTESTINAL:   ABDOMEN:  Soft, nontender.   GENITOURINARY:   BLADDER:  No suprapubic or costovertebral angle tenderness.    ASSESSMENT/PLAN:                   Type 1 diabetes.  I do not quite understand where the confusion on this began.  However, it certainly has been perpetuated through her stay here.  This has been added to by her consultant endocrinologist who has written type 2 diabetes on her consult notes.  I will need to find a way to get some degree of short-acting insulin into this patient before breakfast.  Therefore, I have lowered the threshold to give the am Lantus  insulin to 90.  I have reduced her Lantus insulin to raise the fasting blood sugar enough that she will actually get some short-acting insulin again before breakfast.  I have done away with the sliding scale and I have done away with the Bydureon.

## 2015-07-18 ENCOUNTER — Non-Acute Institutional Stay: Payer: Federal, State, Local not specified - PPO | Admitting: Internal Medicine

## 2015-07-18 DIAGNOSIS — E10319 Type 1 diabetes mellitus with unspecified diabetic retinopathy without macular edema: Secondary | ICD-10-CM | POA: Diagnosis not present

## 2015-07-18 DIAGNOSIS — E1021 Type 1 diabetes mellitus with diabetic nephropathy: Secondary | ICD-10-CM

## 2015-07-18 NOTE — Progress Notes (Signed)
Patient ID: Jessica BlalockSimone D Jaquith, female   DOB: 1971/04/25, 44 y.o.   MRN: [id number]                PROGRESS NOTE  DATE:  07/18/2015          FACILITY: [facility]       LEVEL OF CARE:   SNF   Acute Visit                  HISTORY OF PRESENT ILLNESS:  This is a 44 year-old woman whose care I assumed at the end of August.    At the time, her nursing home documentation labeled this lady as a type 2 diabetic, including a recent consultation with her endocrinologist.  Her notes on [facility] actually make very clear reference that this patient is a type 1 diabetic who became a diabetic at age 89 or 89.  She had had several admissions to [facility] with ketoacidosis.  Her most recent hemoglobin A1c is 7.6, although I think her blood sugars actually are much higher than that and I would not be surprised if her next hemoglobin A1c is higher.    We have had multiple problems related to this.  First of all, I became aware today recently the patient is on Bydureon.  I was not aware that she was taking this.  I stumbled on it looking in her MAR.  This is not indicated for type 1 diabetes.  I will discontinued this    The other issue is that her blood sugar in the morning is always less than 100.  Therefore, according to the orders, she would receive no insulin.  Therefore, her a.c. lunch blood sugars were always in the high 200s and 300s.  I had to correct her Lantus, lower it somewhat, in order that the facility would actually give some insulin before breakfast.  Unfortunately, they misinterpreted my intent of this to change her Lantus to nighttime.  I had no intentions of doing that.  Her Lantus was being given in the morning currently 17 units   Finally, the patient has routine Humalog a.c. meals and then a sliding scale. I have done away with the sliding scale. Currently on Humulog 09/14/09  CBG's 10/10 316/431/336/196 CBG's 10/11 316/179/209/208 CBG's 10/12 230  Revew of systems Resp: no  cough no sputum CVS: no chest pian GI: no abdominal pain no diarrhea.  GU: no dysuria  PHYSICAL EXAMINATION:   CHEST/RESPIRATORY:  Clear air entry bilaterally.    CARDIOVASCULAR:   CARDIAC:  Heart sounds are normal.   GASTROINTESTINAL:   ABDOMEN:  Soft, nontender.   GENITOURINARY:   BLADDER:  No suprapubic or costovertebral angle tenderness.    ASSESSMENT/PLAN:                  Type 1 diabetes; This was mislabeled as type 2 diabetes. I have corrected this. This was through no fault of the facility. I think this is rounding into a more predictable pattern. Increase lantus in the am slightly. This seemed to mostly effect her am cbg the next day. Note stable diabetic retinopathy per opthamology

## 2015-07-24 ENCOUNTER — Other Ambulatory Visit: Payer: Self-pay | Admitting: *Deleted

## 2015-07-24 MED ORDER — TRAMADOL HCL 50 MG PO TABS: ORAL_TABLET | ORAL | Status: DC

## 2015-07-24 NOTE — Telephone Encounter (Signed)
[facility] 

## 2015-07-25 ENCOUNTER — Non-Acute Institutional Stay (SKILLED_NURSING_FACILITY): Payer: Federal, State, Local not specified - PPO | Admitting: Internal Medicine

## 2015-07-25 DIAGNOSIS — E1021 Type 1 diabetes mellitus with diabetic nephropathy: Secondary | ICD-10-CM | POA: Diagnosis not present

## 2015-07-25 DIAGNOSIS — E039 Hypothyroidism, unspecified: Secondary | ICD-10-CM | POA: Diagnosis not present

## 2015-07-25 DIAGNOSIS — E1122 Type 2 diabetes mellitus with diabetic chronic kidney disease: Secondary | ICD-10-CM

## 2015-07-25 DIAGNOSIS — N183 Chronic kidney disease, stage 3 (moderate): Secondary | ICD-10-CM

## 2015-07-30 NOTE — Progress Notes (Addendum)
Patient ID: Jessica BlalockSimone D Stuard, female   DOB: 06-03-1971, 44 y.o.   MRN: [id number]                PROGRESS NOTE  DATE:  07/25/2015        FACILITY: [facility]                   LEVEL OF CARE:   SNF   Acute Visit                  CHIEF COMPLAINT:  Follow up diabetes.    HISTORY OF PRESENT ILLNESS:  I have been following this lady on a weekly basis as I have had to make major adjustments to her diabetic treatment reflecting the fact that she is actually a type 1 diabetic, not a type 2 diabetic as advertised when I took over her care in August.  I have done away with her Bydureon and I have been attempting to get better control of her fasting glucose.    Her mother, who is her responsible party, also is concerned about her lipids, and she apparently had a fall yesterday.     Currently, her diabetes regimen is 20 U of Lantus in the morning and Humalog insulin 12 U a.c. breakfast, 10 U a.c. lunch, and 10 U a.c. dinner.     We seem to have better control of the fasting blood sugar, at 134 yesterday and 194 today.  Blood sugars yesterday were 134, 204, 188, 229.  Today:  194, 254.   I think there is room to increase the Lantus slightly, although I do not want to get this patient anywhere close to hypoglycemia.  I have not added a sliding scale, which was being given erratically in the facility.  Her most hemoglobin A1c is actually 7.7.  I am a bit surprised that it was actually this good.  Nevertheless, for a young type 1 diabetic, this is probably not the best we want to try to achieve.    With regards to her lipids, the last LDL I see was in June at 148.  Apparently, she is intolerant to statins although her mother says this was a kidney issue.       REVIEW OF SYSTEMS: d CHEST/RESPIRATORY:  No cough.  No sputum.    CARDIAC:  No chest pain.   GI:  No abdominal pain.   No diarrhea.    GU:  No dysuria.    MUSCULOSKELETAL:  I do not think she has had any adverse effects of the fall.     PHYSICAL EXAMINATION:   VITAL SIGNS:     PULSE:  78.    RESPIRATIONS:  20.   BLOOD PRESSURE:  140/90.   02 SATURATIONS:  96% on room air.   CHEST/RESPIRATORY:  Clear air entry bilaterally.    CARDIOVASCULAR:   CARDIAC:  Heart sounds are normal.   GASTROINTESTINAL:   ABDOMEN:  Soft, nontender.  No masses.     GENITOURINARY:   BLADDER:  No suprapubic or costovertebral angle tenderness.    NEUROLOGICAL:    SENSATION/STRENGTH:  There is nothing really lateralizing in terms of strength.  I suspect she has diabetic neuropathy.    DEEP TENDON REFLEXES:  She is diffusely hyporeflexic.      ASSESSMENT/PLAN:                   Type 1 diabetes.  I think we need to still get better control of  the fasting blood sugar.  Towards this end, I am going to gently and carefully increase the Lantus that we are giving in the morning.    Hypercholesterolemia.  Her LDL is 148.   Statins are not listed as an allergy in her record.   I am going to start her on Zetia.    Diabetic nephropathy.  She has microalbuminuria.  She is on Cozaar.  This seems appropriate, although I am not exactly sure how this choice was made.  BP uncer marginal control.   Hypothyroidism.  On replacement.  Her TSH was 1.490 in August.  This is acceptable.

## 2015-08-06 ENCOUNTER — Non-Acute Institutional Stay: Payer: Federal, State, Local not specified - PPO | Admitting: Internal Medicine

## 2015-08-06 DIAGNOSIS — E1021 Type 1 diabetes mellitus with diabetic nephropathy: Secondary | ICD-10-CM

## 2015-08-12 NOTE — Progress Notes (Addendum)
Patient ID: [name], female   DOB: 1971/01/23, 44 y.o.   MRN: [id number]                PROGRESS NOTE  DATE:  08/06/2015            FACILITY: [facility]                LEVEL OF CARE:   SNF   Acute Visit                CHIEF COMPLAINT:  Follow up type 1 diabetes.     HISTORY OF PRESENT ILLNESS:  Once again, I am seeing this lady in trying to fine-tune her type 1 diabetes.  When I first took over her care in August, she was advertised as a type 2 diabetic, not just in the facility records but in the records of her endocrinologist.   I am rounding this into form.  Currently, she is on 23 U of Lantus in the morning, Humalog 12 U a.c. breakfast, 10 U a.c. lunch, and 10 U a.c. dinner.  Blood sugars are generally in the mid to upper 100s fasting.  She is generally a lot higher a.c. lunch than the rest of the day.    REVIEW OF SYSTEMS:    CHEST/RESPIRATORY:  The patient is not complaining of cough or shortness of breath.      CARDIAC:  No chest pain.   GI:  No nausea, vomiting, or diarrhea.        GU:  No dysuria.    MUSCULOSKELETAL:  She states she has pain in her legs when she tries to walk.     NEUROLOGICAL:  Strength in her lower legs seems normal.   Reflexes absent, compatible with diabetic neuropathy.  When asked where the pain is, she rubs her anterior thighs bilaterally.    PHYSICAL EXAMINATION:   GENERAL APPEARANCE:  The patient is not in any distress.      CHEST/RESPIRATORY:  Exam is clear.       CARDIOVASCULAR:   CARDIAC:  Heart sounds are normal.  There are no murmurs.   No gallops.     GASTROINTESTINAL:   LIVER/SPLEEN/KIDNEYS:  No liver, no spleen.  No tenderness.     GENITOURINARY:   BLADDER:  Not distended.   There is no CVA tenderness.    NEUROLOGICAL:    SENSATION/STRENGTH:  The strength in her legs is normal.    DEEP TENDON REFLEXES:  Her reflexes are absent.   BALANCE/GAIT:  She cannot bring herself to a standing position.   PSYCHIATRIC:   MENTAL  STATUS:  I see no evidence of depression.     ASSESSMENT/PLAN:              Type 1 diabetes.  I am going to increase the fasting Humalog to 15 U, everything else remaining the same.     Diabetic nephropathy.   She has microalbuminuria.    She is on Cozaar.  Her blood pressures are under fairly good control, most recently 124/66.

## 2015-08-22 ENCOUNTER — Non-Acute Institutional Stay (SKILLED_NURSING_FACILITY): Payer: Federal, State, Local not specified - PPO | Admitting: Internal Medicine

## 2015-08-22 DIAGNOSIS — I63321 Cerebral infarction due to thrombosis of right anterior cerebral artery: Secondary | ICD-10-CM | POA: Diagnosis not present

## 2015-08-22 DIAGNOSIS — E1021 Type 1 diabetes mellitus with diabetic nephropathy: Secondary | ICD-10-CM | POA: Diagnosis not present

## 2015-08-25 ENCOUNTER — Non-Acute Institutional Stay (SKILLED_NURSING_FACILITY): Payer: Federal, State, Local not specified - PPO | Admitting: Internal Medicine

## 2015-08-25 DIAGNOSIS — E1021 Type 1 diabetes mellitus with diabetic nephropathy: Secondary | ICD-10-CM

## 2015-08-25 DIAGNOSIS — E1122 Type 2 diabetes mellitus with diabetic chronic kidney disease: Secondary | ICD-10-CM | POA: Diagnosis not present

## 2015-08-25 DIAGNOSIS — N183 Chronic kidney disease, stage 3 (moderate): Secondary | ICD-10-CM

## 2015-08-25 DIAGNOSIS — I63321 Cerebral infarction due to thrombosis of right anterior cerebral artery: Secondary | ICD-10-CM | POA: Diagnosis not present

## 2015-08-27 NOTE — Progress Notes (Addendum)
Patient ID: [name], female   DOB: 12-Aug-1971, 44 y.o.   MRN: [id number]                PROGRESS NOTE  DATE:  08/22/2015          FACILITY[facility]              LEVEL OF CARE:   SNF   Acute Visit                 CHIEF COMPLAINT:  Follow up multiple medical issues over the last week.     HISTORY OF PRESENT ILLNESS:  I have fielded multiple calls on [name] since I was last here.   I had last seen her on 08/06/2015.     Apparently, she was noted to have increasing edema in her left hand and bilateral legs.  She went on to have two chest x-rays, one on 08/16/2015 which showed cardiomegaly, a left pleural effusion, passive left lower lobe atelectasis or associated pneumonia.  She had another chest x-ray on 08/17/2015, now showing mild patchy right basilar density compatible with pneumonia or atelectasis.  There was no comment on the left pleural effusion.  When this was called to me, I put her on 500 mg of Levaquin for five days which should have completed today.  She has also had duplex ultrasounds of the left arm and her bilateral legs, which were negative for DVT.    Independently of this, she follows with a Dr. Loleta ChanceHill who I believe is a neurologist associated with [facility] at the [place] facility.  She had a CT scan of her head out of concern about her not being able to walk.  This came back showing a focus of edema and decreased attenuation in the medial right frontal lobe.  There is no enhancement, but there is some cortical swelling suggestive of a subacute infarct in the right anterior cerebral artery distribution.  An MRI of the brain was suggested to confirm this and to exclude other pathology like a tumor.  She had no evidence of hydrocephalus.  No intracranial hemorrhage.  Dr. Loleta ChanceHill ordered a large amount of blood work, some of which was redundant to labs we already had in the facility.  We were able to clarify that the only thing he was not able to get done in his  office was a vitamin E level, which they attempted to do here but did not process it properly.    REVIEW OF SYSTEMS:    CHEST/RESPIRATORY:  She is not complaining of shortness of breath.   CARDIAC:  No chest pain.   GI:  No abdominal pain.    NEUROLOGICAL:  Weak and edematous left arm.     PHYSICAL EXAMINATION:   GENERAL APPEARANCE:  The patient does not look to be in any distress.   CHEST/RESPIRATORY:  Clear air entry bilaterally.    CARDIOVASCULAR:   CARDIAC:  Heart sounds are normal.  There are no murmurs.    GASTROINTESTINAL:   ABDOMEN:  Soft.    LIVER/SPLEEN/KIDNEYS:  No liver, no spleen.  No tenderness.     CIRCULATION:   EDEMA/VARICOSITIES:  Extremities:  She does have some edema in the left hand.  This does not pit.   NEUROLOGICAL:    CRANIAL NERVES:  I think she has a left visual field loss.   SENSATION/STRENGTH:  The left arm is now hemiparetic.  She has bilateral lower extremity weakness.  This is not  completely new.  However, at one point she had enough strength to stand but I doubt she would be able to do this now.   DEEP TENDON REFLEXES:  Her reflexes are bilaterally brisk.  Both toes are upgoing.  This is new.    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  She is due to have a MRI of the head tomorrow.  She is on Plavix.   One would wonder whether she needs cardiac echo and/or esophageal echocardiogram to determine the source of bilateral emboli.  The thought that she has had bilateral cerebral infarctions is not new.  However, she certainly has changed since when I did any meaningful neurologic exam on her, which was August.  I have not really seen this patient as functional as what is previously described.  I think I saw her with a walker once.  It was very difficult.  However, given that, I do not  think she could manage that now.    Type 1 diabetes.  Her blood sugars are much better, especially fasting.  She is higher a.c. lunch and I think she is going to need more short-acting insulin before breakfast, although I do not feel pressed to do that now.     Left-sided edema for arm, leg.  I think this may be related to her presumed subacute infarction.    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   Lab work on 08/17/2015 showed a BUN of 21 and a creatinine of 1.22.  This reflects stage III renal insufficiency.   She has also proteinuria known.     I think this lady probably has had a subacute stroke.  I think based on the MRI, she will probably need at least a transthoracic echo.  If there is some suggestion about doing an LP on her, I am not exactly sure of the rationale for that.  However, clearly her status has deteriorated.

## 2015-09-03 NOTE — Progress Notes (Addendum)
Patient ID: BUNA CUPPETT, female   DOB: [birth year], 44 y.o.   MRN: [id number]                PROGRESS NOTE  DATE:  08/25/2015            FAC[facility]             LEVEL OF CARE:   SNF   Acute Visit                 CHIEF COMPLAINT:  Follow up recent right hemisphere CVA, respiratory status.    HISTORY OF PRESENT ILLNESS:  This is a 44 year-old woman who is a type 1 diabetic.    She has stage II-III chronic renal insufficiency, proteinuria.    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    I think it was noted mostly by her family that she had increasing weakness on the left side.  She was taken to her neurologist, Dr. Berdine Addison, who is associated with [facility] in [place].  She had a CT scan of the head that suggested a subacute infarction.  She went for an MRI this week, although I do not have that result and I do not see anything on Care Everywhere.  Her mother came back to the facility, stating that Dr. Berdine Addison had asked for the addition of aspirin 81 to her Plavix 75.  Even though we do not have an order, I gave approval for that although I am looking for them to check with Dr. Cathey Endow office early in the new week.    There are also concerns about respiratory difficulties and edema.  I note that her weight was up to 230 pounds on 08/21/2015.  This would be up 10 pounds since the beginning of October and September, also up 16 pounds since the beginning of August.  A chest x-ray suggested a right basilar infiltrate.  Over the phone, I gave her Levaquin.    REVIEW OF SYSTEMS:    GENERAL:  The patient states she is not feeling poorly.   CHEST/RESPIRATORY:  No shortness of breath.   CARDIAC:  No chest pain.   GI:  No abdominal pain.    GU:  No dysuria.    NEUROLOGICAL:  She states she feels generally weak.  There is pain in her left leg.   ENDOCRINE:  She apparently had a hypoglycemic spell a.c. dinner on 08/22/2015,  although I see no obvious trend here and the next day her blood sugar was over 200 at the same time.  Currently, she receives 23 U of Lantus in the morning, 15 U of Humalog a.c. breakfast if blood sugar greater than 90 and 10 U a.c. lunch and dinner with the same parameter.    PHYSICAL EXAMINATION:   GENERAL APPEARANCE:  The patient is not in any distress.   Her speech is clear.     CHEST/RESPIRATORY:  Clear air entry bilaterally.    CARDIOVASCULAR:   CARDIAC:  Heart sounds are normal.  There is no elevation of her jugular venous pressure.   GASTROINTESTINAL:   LIVER/SPLEEN/KIDNEYS:  No liver, no spleen.  No tenderness.    GENITOURINARY:   BLADDER:  No suprapubic or costovertebral angle tenderness.   NEUROLOGICAL:    CRANIAL NERVES:  Seem intact.    SENSATION/STRENGTH:  She does have increased weakness of the left arm and left leg proximally versus the right.  This would not  be new for her.  She still is able to move the left arm, but nearly as functionally as she was when I first met her.   DEEP TENDON REFLEXES:  She is hyperreflexic bilaterally in the lower extremities.  I think both her plantar responses are extensor.    ASSESSMENT/PLAN:                New right frontal lobe CVA.  Awaiting result of the MRI.  I have added aspirin to her Plavix.  I am not sure where Dr. Berdine Addison wants to go into the investigations, i.e. Echo, TEEs, carotid doppler.  There is no obvious indication here for anticoagulation that I am aware of.  She does not have atrial fibrillation.    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  There were concerns about edema.  In fact, she underwent bilateral lower and left upper extremity doppler studies, I think most of this ordered over the phone.  These were all negative.  I do not see any evidence of congestive heart failure at the bedside.  In late August, I did a microalbumin:creatinine ratio which was 172.9, indicative of proteinuria.   A BNP was only 15 on  08/17/2015.    I do not see any reason to alter this woman's insulin at this point, even though she did have a hypoglycemic spell.  One would wonder whether she missed a meal, delayed meals, etc.    I am awaiting the result of an MRI and any guidance from Dr. Berdine Addison about where he wants to go in investigations here.  Part of this would be information that I do not have access to, which is how this has been investigated in the past.    I have added aspirin to the Plavix, although I wait for information from Dr. Cathey Endow office.       CPT CODE: 73532

## 2015-09-12 ENCOUNTER — Non-Acute Institutional Stay (SKILLED_NURSING_FACILITY): Payer: Federal, State, Local not specified - PPO | Admitting: Internal Medicine

## 2015-09-12 DIAGNOSIS — E1021 Type 1 diabetes mellitus with diabetic nephropathy: Secondary | ICD-10-CM

## 2015-09-12 DIAGNOSIS — E11649 Type 2 diabetes mellitus with hypoglycemia without coma: Secondary | ICD-10-CM

## 2015-09-12 DIAGNOSIS — I63321 Cerebral infarction due to thrombosis of right anterior cerebral artery: Secondary | ICD-10-CM

## 2015-09-16 NOTE — Progress Notes (Addendum)
Patient ID: [name], female   DOB: 10/20/1970, 44 y.o.   MRN: [id number]                PROGRESS NOTE  DATE:  09/12/2015         [facility]              LEVEL OF CARE:   SNF   Acute Visit               CHIEF COMPLAINT:  "Face twitching.  I think she's having a stroke."      HISTORY OF PRESENT ILLNESS:  [name]'s mother came and got me in the hall.  She had noted facial twitching and was concerned that her daughter was having a repeat stroke.    The patient is a type 1 diabetic with chronic renal insufficiency and a recent right hemisphere stroke with left hemiparesis.    When I arrived in the room, she was indeed having left facial twitching, also twitching of her left hand and rhythmic blinking of her left eye.  She was, however, conscious and would respond to her name.  I gave an order for a stat CBG which was 39.  We have her two oral glucose tubes, two injections of Glucagon 1 mg, 15 mg a part, orange juice, and peanut butter crackers.   She has become much more alert and the seizing has stopped.  Notable for the fact that she has taken very little oral intake today, 25% breakfast and lunch.    With regards to her diabetes, she is on Lantus 23 U in the morning, Humalog 15 U a.c. breakfast and 10 U a.c. lunch and dinner.   Her CBGs from yesterday were 180, 178, 95, and 106 at 2100.  As far as I can see, there are no other issues recently.  I am not clear why she is not eating.    As mentioned, she had a recent right hemisphere stroke with left hemiparesis and is followed by Dr. Loleta ChanceHill, her neurologist in [place].    Apparently, she has had an extensive work-up including carotid dopplers, echocardiogram with a bubble study, etc.  She may, per her mother's description, be going on to have a coronary angiogram.    LABORATORY DATA:  Recent lab work really did not look all that remarkable.    CBC showed a white count  of 11.3, otherwise normal.    BUN was 29,  creatinine 1.22, estimated GFR of 54.    Liver function tests were normal.     Albumin was 4.    REVIEW OF SYSTEMS:    GENERAL:  Again, the patient has no complaints although, obviously, she is eating poorly.    CHEST/RESPIRATORY:  No shortness of breath.   CARDIAC:  No chest pain.   GI:  No abdominal pain.    GU:  No dysuria.    NEUROLOGICAL:  No complaints of pain.   ENDOCRINE:   She did have a previous hypoglycemic spell a.c. dinner on 08/22/2015, although there is no real pattern here.  It would appear to me that she is going to need a reduction of the Humalog a.c. lunch and probably a.c. dinner.    PHYSICAL EXAMINATION:   VITAL SIGNS:     TEMPERATURE:  98.9.     PULSE:  72.    RESPIRATIONS:  20.    BLOOD PRESSURE:  152/60.    02 SATURATIONS:  95% on room air.  CHEST/RESPIRATORY:  Clear air entry bilaterally.    CARDIOVASCULAR:   CARDIAC:  Heart sounds are normal.  She appears to be euvolemic.        GASTROINTESTINAL:   ABDOMEN:  Soft.      LIVER/SPLEEN/KIDNEYS:  No liver, no spleen.  No tenderness.     GENITOURINARY:   BLADDER:  No suprapubic or costovertebral angle tenderness.    NEUROLOGICAL:   I see no obvious changes here.     SENSATION/STRENGTH:  Recent left arm greater than left leg hemiparesis.    ASSESSMENT/PLAN:                 Diabetic hypoglycemic spell, manifesting as a focal seizure in the left face and arm.  She is in a poor oral intake phase.  I am not sure how long this has been going on.  I am going to reduce the Humalog a.c. lunch and dinner, keep everything else the same.  She is due for a hemoglobin A1c in two weeks. I am going to reduce her novolg  Poor oral intake.  I am going to have to do some staff education here.  If the patient eats less than 50% of the meals, she is going to need some form of supplement.  This can be something that is not traditionally diabetic such as a peanut butter sandwich or something similar.    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 I see no new focal issues post correction of the hypoglycemia. This is being managed by her neurologist. I would wonder about the bihemispheric nature of her cerebral vascular disease. ?TEE. She also should probably be on a statin   CPT CODE: 16109

## 2015-09-26 ENCOUNTER — Non-Acute Institutional Stay (SKILLED_NURSING_FACILITY): Payer: Federal, State, Local not specified - PPO | Admitting: Internal Medicine

## 2015-09-26 DIAGNOSIS — L97529 Non-pressure chronic ulcer of other part of left foot with unspecified severity: Secondary | ICD-10-CM

## 2015-09-26 DIAGNOSIS — E1122 Type 2 diabetes mellitus with diabetic chronic kidney disease: Secondary | ICD-10-CM

## 2015-09-26 DIAGNOSIS — E10621 Type 1 diabetes mellitus with foot ulcer: Secondary | ICD-10-CM | POA: Diagnosis not present

## 2015-09-26 DIAGNOSIS — E11649 Type 2 diabetes mellitus with hypoglycemia without coma: Secondary | ICD-10-CM

## 2015-09-26 DIAGNOSIS — N183 Chronic kidney disease, stage 3 (moderate): Secondary | ICD-10-CM

## 2015-09-26 DIAGNOSIS — E1021 Type 1 diabetes mellitus with diabetic nephropathy: Secondary | ICD-10-CM

## 2015-10-02 NOTE — Progress Notes (Signed)
Patient ID: [name], female   DOB: [birth year], 44 y.o.   MRN: [id number]                PROGRESS NOTE  DATE:  09/26/2015         [facility]              LEVEL OF CARE:   SNF   Acute Visit                       CHIEF COMPLAINT:  Review of multiple medical issues.       HISTORY OF PRESENT ILLNESS:  [name] is a patient whom I last saw on 09/12/2015 with a profound, late in the afternoon, hypoglycemic spell.  At that point, I reduced her Humalog to 7 U a.c. lunch and dinner, 15 U of Humalog a.c. breakfast, and 23 U of Lantus in the morning.  She presented with a seizure that was seen by her mother.    Since then, her hemoglobin A1c has come down to 6.9.  Blood sugars starting yesterday morning were 178, 99, 64, 76, 151, 129.    With regards to her recent right anterior cerebral artery stroke, she has been following with Dr. Loleta ChanceHill who put her on folic acid and vitamin D2.  She has had antithrombin III levels, thyroid function tests, vitamin B12 level, antinuclear antibody levels, immunofixation electrophoresis, protein C & S levels, lupus anticoagulant, homocysteine level which was slightly elevated (probably the reason for the folate), beta-2 microglobulin levels, anticardiolipin levels, all of which are within the normal range except for the homocysteine and her vitamin D2 levels.    The wound care nurse has some concerns about the left foot and heel.    LABORATORY DATA:   Recent lab work:    Creatinine 1.22, estimated GFR 54.    REVIEW OF SYSTEMS:    GENERAL:  No headache.     CHEST/RESPIRATORY:  No shortness of breath.   CARDIAC:  No chest pain.   GI:  No nausea, vomiting, or diarrhea.  The patient does not appear to be eating that well, looking at her lunch tray.      GU:  She does not complain of dysuria.    SKIN:  The wound care nurse has noted a small area over her Achilles on the left.      PHYSICAL EXAMINATION:   VITAL SIGNS:     TEMPERATURE:   97.1.   PULSE:  72.   RESPIRATIONS:  19.   BLOOD PRESSURE:  135/79.     02 SATURATIONS:  95%.   GENERAL APPEARANCE:  The patient is not in any distress.         CHEST/RESPIRATORY:  Clear air entry bilaterally.    CARDIOVASCULAR:   CARDIAC:  Heart sounds are normal.  There are no murmurs.    GASTROINTESTINAL:   LIVER/SPLEEN/KIDNEYS:  No liver, no spleen.  No tenderness.     GENITOURINARY:   BLADDER:  Not distended or tender.  There is no CVA tenderness.    NEUROLOGICAL:   The patient has made some progress.  She now has movement in the left arm and left leg.   SKIN:   INSPECTION:  There is a small open area in the mid aspect of the Achilles on the left.  This is not a usual spot for a pressure ulcer.  There is bruising on the lateral aspect of her foot.  I  almost wonder if this is ischemic.   VASCULAR:   ARTERIAL:  Peripheral pulses are not palpable.  However, her foot is warm.    ASSESSMENT/PLAN:                    Type 1 diabetes with nephropathy, macrovascular disease.  She has an area on the left Achilles area that is open.  This is not a usual pressure spot.  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  She may very well require arterial dopplers if this area does not progress rapidly towards healing.    Her CBGs appear stable.  As mentioned, her hemoglobin A1c is a lot better at 6.9 than it was previously.  She seems to fall later in the day.  I may cut back the a.c. lunch Humalog yet again.  The rest of everything else looks fairly stable.    Recent right anterior cerebral artery stroke.  Extensively worked up by Dr. Loleta Chance in [place].  I think she was supposed to have a TEE and a bubble study, although I do not know that I have ever seen these results.  She remains on Plavix and aspirin.    Diabetic hypoglycemic spell two weeks ago when I happened to be in the building.  She did not look like she ate a lot of lunch, although her most recent weight of 220 pounds is actually roughly  stable.    Hyperlipidemia.  I put her on Zetia last month.  I am not exactly sure why I did this as opposed to a statin.  She is not listed as allergic to statins.  I will need to review my records.    We will treat the ulcer on the back of the heel.    I made adjustments to her insulin.     CPT CODE: 40981

## 2015-10-03 ENCOUNTER — Non-Acute Institutional Stay (SKILLED_NURSING_FACILITY): Payer: Federal, State, Local not specified - PPO | Admitting: Internal Medicine

## 2015-10-03 DIAGNOSIS — N183 Chronic kidney disease, stage 3 (moderate): Secondary | ICD-10-CM | POA: Diagnosis not present

## 2015-10-03 DIAGNOSIS — E1122 Type 2 diabetes mellitus with diabetic chronic kidney disease: Secondary | ICD-10-CM | POA: Diagnosis not present

## 2015-10-03 DIAGNOSIS — E1021 Type 1 diabetes mellitus with diabetic nephropathy: Secondary | ICD-10-CM | POA: Diagnosis not present

## 2015-10-08 NOTE — Progress Notes (Signed)
Patient ID: [name], female   DOB: [birth year], 45 y.o.   MRN: [id number]                PROGRESS NOTE  DATE:  10/03/2015           [facility]                  LEVEL OF CARE:   SNF   Acute Visit            CHIEF COMPLAINT:  Review of her diabetic status, requested by her mother.    HISTORY OF PRESENT ILLNESS:  This is a patient whom I saw on 09/12/2015 when at that point she had left facial twitching, twitching of her left arm, and rhythmic blinking of her left eye.  We found her blood sugar to be 39.  She was aggressively treated for this hypoglycemic spell.  I dropped her a.c. lunch Humalog and a.c. dinner Humalog to 5 U.  She is on 15 U a.c. breakfast and Lantus insulin 23 U in the morning.  We have generally had better glycemic control and her hemoglobin A1c has slipped below 7 which is desirable in this type 1 diabetic patient.    Unfortunately, her mother arrived today claiming that she had another seizure caused by a low blood sugar of 53.  I have looked through the [facility].  I do not actually see this.  She did have a blood sugar of around 60 at 4 o'clock in the afternoon on 09/26/2015, but there is no reference to any seizure activity.  Her mother also describes episodes of declining oral intake, not getting her snacks, etc., etc.    CURRENT MEDICATIONS:  Diabetic medications currently:     Humalog 15 U a.c. breakfast and 5 U a.c. dinner if her blood sugar is greater than 90.    Lantus insulin 23 U in the morning.      REVIEW OF SYSTEMS:    HEENT:   The patient is not complaining of headache or dizziness.   CHEST/RESPIRATORY:  No shortness of breath.   CARDIAC:  No chest pain.   GI:  No abdominal pain.   No nausea or vomiting.    GU:  No clear dysuria.    NEUROLOGICAL:  She is actually making a decent recovery from her anterior cerebral artery infarction on the right, which had initially left her with left hemiparesis.  Clearly, she is making a good  recovery here.     PHYSICAL EXAMINATION:   GENERAL APPEARANCE:  She looks well.  Bright, alert, conversational.   CARDIOVASCULAR:   CARDIAC:  Heart sounds are normal.  She appears to be euvolemic.        GASTROINTESTINAL:   ABDOMEN:  No masses.    LIVER/SPLEEN/KIDNEYS:  No liver, no spleen.   GENITOURINARY:   BLADDER:  No suprapubic or costovertebral angle tenderness.    NEUROLOGICAL:    SENSATION/STRENGTH:  Really an impressive degree of movement in the left arm, which is quite an improvement from two weeks ago.  As I understand things, however, she has gone off therapy (Medicaid issues).    ASSESSMENT/PLAN:                Type 1 diabetes.  I spent a fair amount of time today talking to the patient's mother and one of the senior nurses in the facility.  I have asked people to pay attention to what this woman eats.  If she eats less than 50%, she should be offered a snack which does not have to be a diabetic-friendly snack.  It simply can be something to keep her blood sugar up.  I have also asked them to make sure that she has assistance with the snack that she is due to receive.  I am hopeful that this will resolve the low blood sugars which seem to be happening late in the afternoon.  I am not really wanting to reduce her insulin much further.     CPT CODE: [id number]

## 2018-12-28 ENCOUNTER — Emergency Department (HOSPITAL_COMMUNITY): Payer: Medicare Other

## 2018-12-28 ENCOUNTER — Other Ambulatory Visit: Payer: Self-pay

## 2018-12-28 ENCOUNTER — Encounter (HOSPITAL_COMMUNITY): Payer: Self-pay | Admitting: Emergency Medicine

## 2018-12-28 ENCOUNTER — Inpatient Hospital Stay (HOSPITAL_COMMUNITY)
Admission: EM | Admit: 2018-12-28 | Discharge: 2018-12-30 | DRG: 872 | Disposition: A | Discharge status: Home / Self Care | Payer: Medicare Other | Source: Skilled Nursing Facility | Attending: Internal Medicine | Admitting: Internal Medicine

## 2018-12-28 ENCOUNTER — Inpatient Hospital Stay: Payer: Self-pay

## 2018-12-28 DIAGNOSIS — E1122 Type 2 diabetes mellitus with diabetic chronic kidney disease: Secondary | ICD-10-CM | POA: Diagnosis present

## 2018-12-28 DIAGNOSIS — R739 Hyperglycemia, unspecified: Secondary | ICD-10-CM

## 2018-12-28 DIAGNOSIS — A419 Sepsis, unspecified organism: Principal | ICD-10-CM | POA: Diagnosis present

## 2018-12-28 DIAGNOSIS — Z888 Allergy status to other drugs, medicaments and biological substances status: Secondary | ICD-10-CM

## 2018-12-28 DIAGNOSIS — E039 Hypothyroidism, unspecified: Secondary | ICD-10-CM | POA: Diagnosis present

## 2018-12-28 DIAGNOSIS — Z79890 Hormone replacement therapy (postmenopausal): Secondary | ICD-10-CM

## 2018-12-28 DIAGNOSIS — N183 Chronic kidney disease, stage 3 (moderate): Secondary | ICD-10-CM | POA: Diagnosis present

## 2018-12-28 DIAGNOSIS — R6520 Severe sepsis without septic shock: Secondary | ICD-10-CM

## 2018-12-28 DIAGNOSIS — I129 Hypertensive chronic kidney disease with stage 1 through stage 4 chronic kidney disease, or unspecified chronic kidney disease: Secondary | ICD-10-CM | POA: Diagnosis present

## 2018-12-28 DIAGNOSIS — Z885 Allergy status to narcotic agent status: Secondary | ICD-10-CM

## 2018-12-28 DIAGNOSIS — N390 Urinary tract infection, site not specified: Secondary | ICD-10-CM | POA: Diagnosis present

## 2018-12-28 DIAGNOSIS — I69311 Memory deficit following cerebral infarction: Secondary | ICD-10-CM | POA: Diagnosis not present

## 2018-12-28 DIAGNOSIS — E1165 Type 2 diabetes mellitus with hyperglycemia: Secondary | ICD-10-CM | POA: Diagnosis present

## 2018-12-28 DIAGNOSIS — E7800 Pure hypercholesterolemia, unspecified: Secondary | ICD-10-CM | POA: Diagnosis present

## 2018-12-28 DIAGNOSIS — Z794 Long term (current) use of insulin: Secondary | ICD-10-CM

## 2018-12-28 DIAGNOSIS — F0150 Vascular dementia without behavioral disturbance: Secondary | ICD-10-CM | POA: Diagnosis present

## 2018-12-28 DIAGNOSIS — Z882 Allergy status to sulfonamides status: Secondary | ICD-10-CM

## 2018-12-28 DIAGNOSIS — Z7902 Long term (current) use of antithrombotics/antiplatelets: Secondary | ICD-10-CM | POA: Diagnosis not present

## 2018-12-28 DIAGNOSIS — Z79899 Other long term (current) drug therapy: Secondary | ICD-10-CM

## 2018-12-28 DIAGNOSIS — N179 Acute kidney failure, unspecified: Secondary | ICD-10-CM

## 2018-12-28 LAB — CBC WITH DIFFERENTIAL/PLATELET
ABS IMMATURE GRANULOCYTES: 0.23 10*3/uL — AB (ref 0.00–0.07)
BASOS PCT: 0 %
Basophils Absolute: 0.1 10*3/uL (ref 0.0–0.1)
Eosinophils Absolute: 0 10*3/uL (ref 0.0–0.5)
Eosinophils Relative: 0 %
HCT: 43.1 % (ref 36.0–46.0)
Hemoglobin: 12.7 g/dL (ref 12.0–15.0)
Immature Granulocytes: 1 %
Lymphocytes Relative: 10 %
Lymphs Abs: 1.8 10*3/uL (ref 0.7–4.0)
MCH: 27.1 pg (ref 26.0–34.0)
MCHC: 29.5 g/dL — ABNORMAL LOW (ref 30.0–36.0)
MCV: 92.1 fL (ref 80.0–100.0)
MONO ABS: 0.8 10*3/uL (ref 0.1–1.0)
Monocytes Relative: 4 %
Neutro Abs: 16 10*3/uL — ABNORMAL HIGH (ref 1.7–7.7)
Neutrophils Relative %: 85 %
Platelets: 409 10*3/uL — ABNORMAL HIGH (ref 150–400)
RBC: 4.68 MIL/uL (ref 3.87–5.11)
RDW: 15.7 % — ABNORMAL HIGH (ref 11.5–15.5)
WBC: 18.9 10*3/uL — AB (ref 4.0–10.5)
nRBC: 0 % (ref 0.0–0.2)

## 2018-12-28 LAB — COMPREHENSIVE METABOLIC PANEL
ALT: 12 U/L (ref 0–44)
AST: 21 U/L (ref 15–41)
Albumin: 4.2 g/dL (ref 3.5–5.0)
Alkaline Phosphatase: 146 U/L — ABNORMAL HIGH (ref 38–126)
Anion gap: >20 — ABNORMAL HIGH (ref 5–15)
BILIRUBIN TOTAL: 1.4 mg/dL — AB (ref 0.3–1.2)
BUN: 51 mg/dL — ABNORMAL HIGH (ref 6–20)
CO2: 7 mmol/L — ABNORMAL LOW (ref 22–32)
Calcium: 9.9 mg/dL (ref 8.9–10.3)
Chloride: 108 mmol/L (ref 98–111)
Creatinine, Ser: 2.46 mg/dL — ABNORMAL HIGH (ref 0.44–1.00)
GFR calc Af Amer: 26 mL/min — ABNORMAL LOW (ref >60)
GFR, EST NON AFRICAN AMERICAN: 22 mL/min — AB (ref >60)
Glucose, Bld: 294 mg/dL — ABNORMAL HIGH (ref 70–99)
Potassium: 4.8 mmol/L (ref 3.5–5.1)
Sodium: 143 mmol/L (ref 135–145)
Total Protein: 8.2 g/dL — ABNORMAL HIGH (ref 6.5–8.1)

## 2018-12-28 LAB — URINALYSIS, ROUTINE W REFLEX MICROSCOPIC
Bilirubin Urine: NEGATIVE
Glucose, UA: >=500 mg/dL — AB
Ketones, ur: 20 mg/dL — AB
Nitrite: NEGATIVE
Protein, ur: 30 mg/dL — AB
Specific Gravity, Urine: 1.01 (ref 1.005–1.030)
WBC, UA: >50 WBC/hpf — ABNORMAL HIGH (ref 0–5)
pH: 5 (ref 5.0–8.0)

## 2018-12-28 LAB — CBG MONITORING, ED
Glucose-Capillary: 390 mg/dL — ABNORMAL HIGH (ref 70–99)
Glucose-Capillary: 185 mg/dL — ABNORMAL HIGH (ref 70–99)

## 2018-12-28 LAB — LACTIC ACID, PLASMA
Lactic Acid, Venous: 6.5 mmol/L (ref 0.5–1.9)
Lactic Acid, Venous: 1 mmol/L (ref 0.5–1.9)

## 2018-12-28 LAB — GLUCOSE, CAPILLARY: Glucose-Capillary: 455 mg/dL — ABNORMAL HIGH (ref 70–99)

## 2018-12-28 MED ORDER — INSULIN GLARGINE 100 UNIT/ML ~~LOC~~ SOLN
12.0000 [IU] | Freq: Every day | SUBCUTANEOUS | Status: DC
  Administered 2018-12-28: 12 [IU] via SUBCUTANEOUS
  Filled 2018-12-28 (×2): qty 0.12

## 2018-12-28 MED ORDER — CHLORHEXIDINE GLUCONATE CLOTH 2 % EX PADS
6.0000 | MEDICATED_PAD | Freq: Every day | CUTANEOUS | Status: DC
  Administered 2018-12-29 – 2018-12-30 (×2): 6 via TOPICAL

## 2018-12-28 MED ORDER — SODIUM CHLORIDE 0.45 % IV SOLN
INTRAVENOUS | Status: DC
  Administered 2018-12-28: 20:00:00 via INTRAVENOUS

## 2018-12-28 MED ORDER — CITALOPRAM HYDROBROMIDE 20 MG PO TABS
20.0000 mg | ORAL_TABLET | Freq: Every day | ORAL | Status: DC
  Administered 2018-12-29 – 2018-12-30 (×2): 20 mg via ORAL
  Filled 2018-12-28 (×2): qty 1

## 2018-12-28 MED ORDER — METRONIDAZOLE IN NACL 5-0.79 MG/ML-% IV SOLN
500.0000 mg | Freq: Once | INTRAVENOUS | Status: AC
  Administered 2018-12-28: 500 mg via INTRAVENOUS
  Filled 2018-12-28: qty 100

## 2018-12-28 MED ORDER — SODIUM CHLORIDE 0.45 % IV SOLN: INTRAVENOUS | Status: AC

## 2018-12-28 MED ORDER — INSULIN ASPART 100 UNIT/ML ~~LOC~~ SOLN
12.0000 [IU] | Freq: Once | SUBCUTANEOUS | Status: AC
  Administered 2018-12-28: 12 [IU] via SUBCUTANEOUS

## 2018-12-28 MED ORDER — SODIUM CHLORIDE 0.9 % IV SOLN: INTRAVENOUS | Status: DC

## 2018-12-28 MED ORDER — SODIUM CHLORIDE 0.9 % IV BOLUS (SEPSIS)
1000.0000 mL | Freq: Once | INTRAVENOUS | Status: AC
  Administered 2018-12-28: 1000 mL via INTRAVENOUS

## 2018-12-28 MED ORDER — SODIUM CHLORIDE 0.9% FLUSH
10.0000 mL | Freq: Two times a day (BID) | INTRAVENOUS | Status: DC
  Administered 2018-12-28 – 2018-12-30 (×4): 10 mL

## 2018-12-28 MED ORDER — VANCOMYCIN HCL IN DEXTROSE 1-5 GM/200ML-% IV SOLN
1000.0000 mg | INTRAVENOUS | Status: AC
  Administered 2018-12-28 (×2): 1000 mg via INTRAVENOUS
  Filled 2018-12-28 (×2): qty 200

## 2018-12-28 MED ORDER — LEVOTHYROXINE SODIUM 25 MCG PO TABS
137.0000 ug | ORAL_TABLET | Freq: Every day | ORAL | Status: DC
  Administered 2018-12-29 – 2018-12-30 (×2): 137 ug via ORAL
  Filled 2018-12-28 (×2): qty 1

## 2018-12-28 MED ORDER — INSULIN REGULAR(HUMAN) IN NACL 100-0.9 UT/100ML-% IV SOLN: INTRAVENOUS | Status: DC

## 2018-12-28 MED ORDER — ASPIRIN 81 MG PO CHEW
81.0000 mg | CHEWABLE_TABLET | Freq: Every day | ORAL | Status: DC
  Administered 2018-12-28 – 2018-12-30 (×3): 81 mg via ORAL
  Filled 2018-12-28 (×3): qty 1

## 2018-12-28 MED ORDER — SODIUM CHLORIDE 0.9% FLUSH: 10.0000 mL | INTRAVENOUS | Status: DC | PRN

## 2018-12-28 MED ORDER — HEPARIN SODIUM (PORCINE) 5000 UNIT/ML IJ SOLN
5000.0000 [IU] | Freq: Three times a day (TID) | INTRAMUSCULAR | Status: DC
  Administered 2018-12-28 – 2018-12-30 (×5): 5000 [IU] via SUBCUTANEOUS
  Filled 2018-12-28 (×5): qty 1

## 2018-12-28 MED ORDER — SODIUM CHLORIDE 0.9 % IV BOLUS
1000.0000 mL | Freq: Once | INTRAVENOUS | Status: AC
  Administered 2018-12-28: 1000 mL via INTRAVENOUS

## 2018-12-28 MED ORDER — VANCOMYCIN HCL IN DEXTROSE 1-5 GM/200ML-% IV SOLN: 1000.0000 mg | Freq: Once | INTRAVENOUS | Status: DC

## 2018-12-28 MED ORDER — CLOPIDOGREL BISULFATE 75 MG PO TABS
75.0000 mg | ORAL_TABLET | Freq: Every day | ORAL | Status: DC
  Administered 2018-12-29 – 2018-12-30 (×2): 75 mg via ORAL
  Filled 2018-12-28 (×2): qty 1

## 2018-12-28 MED ORDER — EZETIMIBE 10 MG PO TABS
10.0000 mg | ORAL_TABLET | Freq: Every day | ORAL | Status: DC
  Administered 2018-12-28 – 2018-12-29 (×2): 10 mg via ORAL
  Filled 2018-12-28 (×2): qty 1

## 2018-12-28 MED ORDER — SODIUM CHLORIDE 0.9 % IV SOLN: 2.0000 g | INTRAVENOUS | Status: DC

## 2018-12-28 MED ORDER — DEXTROSE-NACL 5-0.45 % IV SOLN
INTRAVENOUS | Status: DC
  Administered 2018-12-28: 15:00:00 via INTRAVENOUS

## 2018-12-28 MED ORDER — SODIUM CHLORIDE 0.9 % IV SOLN
2.0000 g | Freq: Once | INTRAVENOUS | Status: AC
  Administered 2018-12-28: 2 g via INTRAVENOUS
  Filled 2018-12-28: qty 2

## 2018-12-28 MED ORDER — VANCOMYCIN HCL 10 G IV SOLR
1250.0000 mg | INTRAVENOUS | Status: DC
  Filled 2018-12-28: qty 1250

## 2018-12-28 MED ORDER — FLUTICASONE PROPIONATE 50 MCG/ACT NA SUSP
1.0000 | Freq: Every day | NASAL | Status: DC
  Administered 2018-12-28 – 2018-12-30 (×3): 1 via NASAL
  Filled 2018-12-28: qty 16

## 2018-12-28 MED ORDER — SODIUM CHLORIDE 0.45 % IV SOLN: INTRAVENOUS | Status: DC

## 2018-12-28 MED ORDER — INSULIN ASPART 100 UNIT/ML ~~LOC~~ SOLN: 0.0000 [IU] | Freq: Three times a day (TID) | SUBCUTANEOUS | Status: DC

## 2018-12-28 MED ORDER — METRONIDAZOLE IN NACL 5-0.79 MG/ML-% IV SOLN
500.0000 mg | Freq: Three times a day (TID) | INTRAVENOUS | Status: DC
  Administered 2018-12-28 – 2018-12-30 (×5): 500 mg via INTRAVENOUS
  Filled 2018-12-28 (×5): qty 100

## 2018-12-28 MED ORDER — ALLOPURINOL 300 MG PO TABS
150.0000 mg | ORAL_TABLET | Freq: Every day | ORAL | Status: DC
  Administered 2018-12-29 – 2018-12-30 (×2): 150 mg via ORAL
  Filled 2018-12-28 (×2): qty 1

## 2018-12-28 MED ORDER — SIMVASTATIN 20 MG PO TABS
10.0000 mg | ORAL_TABLET | Freq: Every day | ORAL | Status: DC
  Administered 2018-12-28 – 2018-12-29 (×2): 10 mg via ORAL
  Filled 2018-12-28 (×2): qty 1

## 2018-12-28 NOTE — ED Notes (Signed)
Date and time results received: 12/28/18 1231 (use smartphrase ".now" to insert current time)  Test: lactic acid Critical Value: 6.5  Name of Provider Notified: Zackowski  Orders Received? Or Actions Taken?: na

## 2018-12-28 NOTE — ED Notes (Signed)
[facility] called and will call us when they are about to arrive

## 2018-12-28 NOTE — Progress Notes (Signed)
Pharmacy Antibiotic Note  [name] is a 48 y.o. female admitted on 12/28/2018 with infection- unknown source.  Pharmacy has been consulted for Vancomycin and Cefepime dosing.  Plan: Vancomycin 1250 mg IV every 24 hours.  Goal trough 15-20 mcg/mL.  Cefepime 2000 mg IV every 24 hours. Monitor labs, c/s, and vanco levels as indicated.  Height: 5\' 6"  (167.6 cm) Weight: 208 lb (94.3 kg) IBW/kg (Calculated) : 59.3  Temp (24hrs), Avg:97.8 F (36.6 C), Min:97.8 F (36.6 C), Max:97.8 F (36.6 C)  Recent Labs  Lab 12/28/18 1202  WBC 18.9*  CREATININE 2.46*  LATICACIDVEN 6.5*    Estimated Creatinine Clearance: 32.4 mL/min (A) (by C-G formula based on SCr of 2.46 mg/dL (H)).    Allergies  Allergen Reactions  . Topiramate Nausea And Vomiting  . Clindamycin/Lincomycin Nausea And Vomiting  . Sulfamethoxazole-Trimethoprim Hives and Rash  . Bactericin [Bacitracin]   . Codeine Nausea And Vomiting  . Doxycycline     Antimicrobials this admission: Vanco 3/24 >>  Cefepime 3/24 >>   Dose adjustments this admission: Vanco/Cefepime  Microbiology results: 3/24 BCx: pending 3/24 UCx:       Thank you for allowing pharmacy to be a part of this patient's care.  Tad Moore 12/28/2018 4:08 PM

## 2018-12-28 NOTE — ED Notes (Signed)
Oscar La (Mother/Power of Attorney)  [phone] Would like to be called with any updates.

## 2018-12-28 NOTE — ED Provider Notes (Signed)
[facility] EMERGENCY DEPARTMENT Provider Note   CSN: [id number] Arrival date & time: 12/28/18  0945    History   Chief Complaint Chief Complaint  Patient presents with   Hyperglycemia    HPI [name] is a 48 y.o. female.     Patient with history of dementia.  Multifocal dementia.  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  Brought to the emergency department from [facility] facility for increased blood sugars.  Patient on arrival was able to give her name but does not answer very many questions sorted denied any pain.  Patient's vital signs on presentation she was tachycardic and tachypneic blood pressure was 1 marginal with a blood pressure of 95/66.  She was afebrile.  Vital signs met sepsis criteria.  Or at least concern for it.     Past Medical History:  Diagnosis Date   Dementia ([place])    Diabetes mellitus without complication ([place])    High cholesterol    Hypertension    Stroke [facility])    Thyroid disease     Patient Active Problem List   Diagnosis Date Noted   Type 1 diabetes mellitus with nephropathy ([place]) 07/14/2015   Dementia, multiinfarct ([place]) 07/14/2015   Stage 3 chronic renal impairment associated with type 2 diabetes mellitus ([place]) 07/14/2015   Carpal tunnel syndrome 07/14/2015    Past Surgical History:  Procedure Laterality Date   APPENDECTOMY       OB History   No obstetric history on file.      Home Medications    Prior to Admission medications   Medication Sig Start Date End Date Taking? Authorizing Provider  acetaminophen (TYLENOL) 325 MG tablet Take 650 mg by mouth every 4 (four) hours as needed.   Yes [provider]  allopurinol (ZYLOPRIM) 300 MG tablet Take 150 mg by mouth daily.   Yes [provider]  amLODipine (NORVASC) 5 MG tablet Take 5 mg by mouth daily.   Yes [provider]  aspirin 81 MG chewable tablet Chew 81 mg by mouth daily.   Yes [provider]    clopidogrel (PLAVIX) 75 MG tablet Take 75 mg by mouth daily.   Yes [provider]  escitalopram (LEXAPRO) 5 MG tablet Take 5 mg by mouth daily.   Yes [provider]  ezetimibe (ZETIA) 10 MG tablet Take 10 mg by mouth daily.   Yes [provider]  fluticasone (FLONASE) 50 MCG/ACT nasal spray Place 1 spray into both nostrils daily.   Yes [provider]  folic acid (FOLVITE) 1 MG tablet Take 1 mg by mouth daily.   Yes [provider]  furosemide (LASIX) 20 MG tablet Take 20 mg by mouth daily.   Yes [provider]  gabapentin (NEURONTIN) 300 MG capsule Take 300 mg by mouth 2 (two) times daily.   Yes [provider]  insulin aspart (NOVOLOG) 100 UNIT/ML injection Inject 8-10 Units into the skin 3 (three) times daily with meals. Take 10 units in the morning- if blood glucose is below 100, do not give; then take 8 units twice a day - if blood glucose is below 100, do not give  Sliding Scale: 100-149BG -0 units, 150-199BG -1 unit, 200-249BG -2units, 250-299BG -4units, 300-349BG -6units, 350-399BG -8units, 400BG+ notify MD   Yes [provider]  insulin glargine (LANTUS) 100 UNIT/ML injection Inject 12 Units into the skin at bedtime.   Yes [provider]  levothyroxine ([place], [place]) 137  MCG tablet Take 137 mcg by mouth daily before breakfast.   Yes [provider]  losartan (COZAAR) 100 MG tablet Take 100 mg by mouth daily.   Yes [provider]  magnesium hydroxide (MILK OF MAGNESIA) 400 MG/5ML suspension Take 30 mLs by mouth daily as needed for mild constipation.   Yes [provider]  medroxyPROGESTERone (DEPO-PROVERA) 150 MG/ML injection Inject 150 mg into the muscle every 3 (three) months.   Yes [provider]  Multiple Vitamin (MULTIVITAMIN WITH MINERALS) TABS tablet Take 1 tablet by mouth daily.   Yes [provider]  Omega-3 Fatty Acids (OMEGA ESSENTIALS  BASIC) LIQD Take 1,500 mg by mouth daily.   Yes [provider]  polyethylene glycol (MIRALAX / GLYCOLAX) packet Take 17 g by mouth every other day.   Yes [provider]  polyvinyl alcohol (LIQUIFILM TEARS) 1.4 % ophthalmic solution Place 1 drop into both eyes 2 (two) times daily.   Yes [provider]  potassium chloride (K-DUR,KLOR-CON) 10 MEQ tablet Take 10 mEq by mouth daily.   Yes [provider]  simvastatin (ZOCOR) 10 MG tablet Take 10 mg by mouth daily.   Yes [provider]  Vitamin D, Ergocalciferol, (DRISDOL) 1.25 MG (50000 UT) CAPS capsule Take 50,000 Units by mouth every 7 (seven) days.   Yes [provider]    Family History No family history on file.  Social History Social History   Tobacco Use   Smoking status: Never Smoker   Smokeless tobacco: Never Used  Substance Use Topics   Alcohol use: No   Drug use: No     Allergies   Topiramate; Clindamycin/lincomycin; Sulfamethoxazole-trimethoprim; Bactericin [bacitracin]; Codeine; and Doxycycline   Review of Systems Review of Systems  Unable to perform ROS: Dementia     Physical Exam Updated Vital Signs BP 117/81    Pulse 89    Temp 97.8 F (36.6 C) (Oral)    Resp 15    Ht 1.676 m (5' 6" )    Wt 94.3 kg    SpO2 100%    BMI 33.57 kg/m   Physical Exam Vitals signs and nursing note reviewed.  Constitutional:      General: She is not in acute distress.    Appearance: She is well-developed.  HENT:     Head: Normocephalic and atraumatic.     Nose: No congestion.     Mouth/Throat:     Mouth: Mucous membranes are dry.  Eyes:     Extraocular Movements: Extraocular movements intact.     Conjunctiva/sclera: Conjunctivae normal.     Pupils: Pupils are equal, round, and reactive to light.  Neck:     Musculoskeletal: Normal range of motion and neck supple.  Cardiovascular:     Rate and Rhythm: Regular rhythm. Tachycardia present.     Heart sounds: No murmur.   Pulmonary:     Effort: Pulmonary effort is normal. No respiratory distress.     Breath sounds: Normal breath sounds. No wheezing or rales.     Comments: Tachypneic Abdominal:     Palpations: Abdomen is soft.     Tenderness: There is no abdominal tenderness.  Musculoskeletal:        General: No swelling.  Skin:    General: Skin is warm and dry.     Findings: No rash.  Neurological:     General: No focal deficit present.     Mental Status: She is alert. Mental status is at baseline.  ED Treatments / Results  Labs (all labs ordered are listed, but only abnormal results are displayed) Labs Reviewed  LACTIC ACID, PLASMA - Abnormal; Notable for the following components:      Result Value   Lactic Acid, Venous 6.5 (*)    All other components within normal limits  COMPREHENSIVE METABOLIC PANEL - Abnormal; Notable for the following components:   CO2 7 (*)    Glucose, Bld 294 (*)    BUN 51 (*)    Creatinine, Ser 2.46 (*)    Total Protein 8.2 (*)    Alkaline Phosphatase 146 (*)    Total Bilirubin 1.4 (*)    GFR calc non Af Amer 22 (*)    GFR calc Af Amer 26 (*)    Anion gap >20.0 (*)    All other components within normal limits  CBC WITH DIFFERENTIAL/PLATELET - Abnormal; Notable for the following components:   WBC 18.9 (*)    MCHC 29.5 (*)    RDW 15.7 (*)    Platelets 409 (*)    Neutro Abs 16.0 (*)    Abs Immature Granulocytes 0.23 (*)    All other components within normal limits  CBG MONITORING, ED - Abnormal; Notable for the following components:   Glucose-Capillary 390 (*)    All other components within normal limits  CBG MONITORING, ED - Abnormal; Notable for the following components:   Glucose-Capillary 185 (*)    All other components within normal limits  CULTURE, BLOOD (ROUTINE X 2)  CULTURE, BLOOD (ROUTINE X 2)  URINE CULTURE  LACTIC ACID, PLASMA  URINALYSIS, ROUTINE W REFLEX MICROSCOPIC    EKG EKG Interpretation  Date/Time:  Tuesday December 28 2018  09:53:39 EDT Ventricular Rate:  115 PR Interval:    QRS Duration: 92 QT Interval:  342 QTC Calculation: 473 R Axis:   143 Text Interpretation:  Sinus tachycardia Probable right ventricular hypertrophy Inferior infarct, old Baseline wander in lead(s) II III aVF Confirmed by Fredia Sorrow [id number]) on 12/28/2018 9:58:34 AM   Radiology Ct Abdomen Pelvis Wo Contrast  Result Date: 12/28/2018 CLINICAL DATA:  Hyperglycemia. EXAM: CT ABDOMEN AND PELVIS WITHOUT CONTRAST TECHNIQUE: Multidetector CT imaging of the abdomen and pelvis was performed following the standard protocol without IV contrast. COMPARISON:  None. FINDINGS: Lower chest: Calcific atherosclerotic disease of the coronary arteries. Moderate hiatal hernia. Hepatobiliary: No focal liver abnormality is seen. No gallstones, gallbladder wall thickening, or biliary dilatation. Pancreas: Unremarkable. No pancreatic ductal dilatation or surrounding inflammatory changes. Spleen: Normal in size without focal abnormality. Adrenals/Urinary Tract: Adrenal glands are unremarkable. Kidneys are normal, without renal calculi, focal lesion, or hydronephrosis. Mild diffuse thickening of the urinary bladder wall. Stomach/Bowel: Stomach is within normal limits. Appendix appears normal. No evidence of bowel wall thickening, distention, or inflammatory changes. Vascular/Lymphatic: Aortic atherosclerosis. No enlarged abdominal or pelvic lymph nodes. Reproductive: Uterus and bilateral adnexa are unremarkable. Other: Small fat containing periumbilical hernia. No abdominopelvic ascites. Musculoskeletal: No acute or significant osseous findings. IMPRESSION: 1. No evidence of acute abnormality within the solid abdominal organs. 2. Mild diffuse thickening of the urinary bladder wall. Please correlate to urinalysis. 3. Calcific atherosclerotic disease of the coronary arteries and aorta. 4. Moderate hiatal hernia. Electronically Signed   By: Fidela Salisbury M.D.   On:  12/28/2018 14:39   Dg Chest Port 1 View  Result Date: 12/28/2018 CLINICAL DATA:  Hyperglycemia EXAM: PORTABLE CHEST 1 VIEW COMPARISON:  May 08, 2005 FINDINGS: There is no appreciable edema or consolidation. Heart size  and pulmonary vascularity are normal. No adenopathy. No bone lesions. IMPRESSION: No edema or consolidation. Electronically Signed   By: Lowella Grip III M.D.   On: 12/28/2018 11:11    Procedures Procedures (including critical care time)  CRITICAL CARE Performed by: Fredia Sorrow Total critical care time: 30 minutes Critical care time was exclusive of separately billable procedures and treating other patients. Critical care was necessary to treat or prevent imminent or life-threatening deterioration. Critical care was time spent personally by me on the following activities: development of treatment plan with patient and/or surrogate as well as nursing, discussions with consultants, evaluation of patient's response to treatment, examination of patient, obtaining history from patient or surrogate, ordering and performing treatments and interventions, ordering and review of laboratory studies, ordering and review of radiographic studies, pulse oximetry and re-evaluation of patient's condition.   Medications Ordered in ED Medications  0.9 %  sodium chloride infusion (has no administration in time range)  dextrose 5 %-0.45 % sodium chloride infusion ( Intravenous New Bag/Given 12/28/18 1451)  insulin regular, human (MYXREDLIN) 100 units/ 100 mL infusion (has no administration in time range)  sodium chloride 0.9 % bolus 1,000 mL (1,000 mLs Intravenous New Bag/Given 12/28/18 1449)    And  sodium chloride 0.9 % bolus 1,000 mL (1,000 mLs Intravenous New Bag/Given 12/28/18 1450)    And  sodium chloride 0.9 % bolus 1,000 mL (has no administration in time range)  vancomycin (VANCOCIN) IVPB 1000 mg/200 mL premix (1,000 mg Intravenous New Bag/Given 12/28/18 1455)  sodium chloride  0.9 % bolus 1,000 mL (0 mLs Intravenous Stopped 12/28/18 1234)  ceFEPIme (MAXIPIME) 2 g in sodium chloride 0.9 % 100 mL IVPB (0 g Intravenous Stopped 12/28/18 1353)  metroNIDAZOLE (FLAGYL) IVPB 500 mg (0 mg Intravenous Stopped 12/28/18 1454)     Initial Impression / Assessment and Plan / ED Course  I have reviewed the triage vital signs and the nursing notes.  Pertinent labs & imaging results that were available during my care of the patient were reviewed by me and considered in my medical decision making (see chart for details).       Patient on presentation there was some concern for may be septic vital signs.  Lactic acid came back markedly elevated.  Blood sugar was only 289.  Patient treated as per sepsis protocol.  Started on broad-spectrum antibiotics.  Received IV fluids.  Urinalysis questionable for urinary tract infection.  May be the source of the sepsis.  Probably hyperglycemia secondary to the sepsis as well.  Since chest x-ray did not show any evidence of pneumonia.  CT scan of abdomen was done.  To rule out any intra-abdominal process as the cause.  Patient due to be admitted to the ICU here at any [facility] by the hospitalist service.   Final Clinical Impressions(s) / ED Diagnoses   Final diagnoses:  Sepsis, due to unspecified organism, unspecified whether acute organ dysfunction present [facility])  Hyperglycemia    ED Discharge Orders    None       Fredia Sorrow, MD 01/14/19 2231

## 2018-12-28 NOTE — ED Triage Notes (Signed)
Pt from [facility] for hyperglycemia.  Has had CBG reading "hihg" this morning.

## 2018-12-28 NOTE — Progress Notes (Signed)
Inpatient Diabetes Program Recommendations  AACE/ADA: New Consensus Statement on Inpatient Glycemic Control (2015)  Target Ranges:  Prepandial:   less than 140 mg/dL      Peak postprandial:   less than 180 mg/dL (1-2 hours)      Critically ill patients:  140 - 180 mg/dL   Lab Results  Component Value Date   GLUCAP 185 (H) 12/28/2018    Review of Glycemic Control  Diabetes history: DM1 Outpatient Diabetes medications: Lantus 12 units QHS, Novolog 10-8-8 tidwc. Do not give if CBG < 100 mg/dL + SS 100-149 +0 units 150-199 +1 unit 200-249 +2 units 250-299 +4 units 300-349 +6 units 350-399 +8 units >/= 400 +10 units  Current orders for Inpatient glycemic control: IV insulin per GlucoStabilizer  HgbA1C 6/7% CO2 - 7   AG - > 20  Inpatient Diabetes Program Recommendations:     Continue with insulin drip until criteria is met for discontinuation. Will follow.  Thank you. Lorenda Peck, RD, LDN, CDE Inpatient Diabetes Coordinator [phone]

## 2018-12-28 NOTE — H&P (Addendum)
History and Physical    [name] NFA:[id number] DOB: Dec 17, 1970 DOA: 12/28/2018  PCP: Patient, No Pcp Per   Patient coming from: Home  I have personally briefly reviewed patient's old medical records in [facility]  Chief Complaint: High blood sugar  HPI: [name] is a 48 y.o. female with medical history significant for DM, multi-infarct dementia, CKD 3, who was brought to the ED from [facility] nursing home with reports of high blood sugar. Patient answers very few questions, she tells me her first name, shakes her head to deny difficulty breathing or abdominal pain. She doesn't answer further questions.   ED Course: blood pressure 95/66, heart rate 117. WBC-18.9.  Elevated creatinine 2.46, glucose 294, with anion gap >20.  Bicarb 7.  EKG showed sinus tachycardia rate 115.  Elevated lactic acid 6.8.  ALP- elevated 146. Sepsis protocol started, patient received full 24ml/kg bolus IV fluids, started on IV Vanco, cefepime and metronidazole.  Abdominal CT showed bladder wall thickening otherwise no acute abnormalities.  Portable chest x-ray no acute abnormality.  With initial concern for hyperglycemia, with anion gap metabolic acidosis, started on DKA protocol. Hospitalist to admit for sepsis.   Review of Systems: As per HPI all other systems reviewed and negative.  Past Medical History:  Diagnosis Date   Dementia (HCC)    Diabetes mellitus without complication (HCC)    High cholesterol    Hypertension    Stroke [facility])    Thyroid disease     Past Surgical History:  Procedure Laterality Date   APPENDECTOMY       reports that she has never smoked. She has never used smokeless tobacco. She reports that she does not drink alcohol or use drugs.  Allergies  Allergen Reactions   Topiramate Nausea And Vomiting   Clindamycin/Lincomycin Nausea And Vomiting   Sulfamethoxazole-Trimethoprim Hives and Rash   Bactericin [Bacitracin]    Codeine Nausea And  Vomiting   Doxycycline    Unable to ascertain history due to patient's dementia.  Prior to Admission medications   Medication Sig Start Date End Date Taking? Authorizing Provider  acetaminophen (TYLENOL) 325 MG tablet Take 650 mg by mouth every 4 (four) hours as needed.   Yes [provider]  allopurinol (ZYLOPRIM) 300 MG tablet Take 150 mg by mouth daily.   Yes [provider]  amLODipine (NORVASC) 5 MG tablet Take 5 mg by mouth daily.   Yes [provider]  aspirin 81 MG chewable tablet Chew 81 mg by mouth daily.   Yes [provider]  clopidogrel (PLAVIX) 75 MG tablet Take 75 mg by mouth daily.   Yes [provider]  escitalopram (LEXAPRO) 5 MG tablet Take 5 mg by mouth daily.   Yes [provider]  ezetimibe (ZETIA) 10 MG tablet Take 10 mg by mouth daily.   Yes [provider]  fluticasone (FLONASE) 50 MCG/ACT nasal spray Place 1 spray into both nostrils daily.   Yes [provider]  folic acid (FOLVITE) 1 MG tablet Take 1 mg by mouth daily.   Yes [provider]  furosemide (LASIX) 20 MG tablet Take 20 mg by mouth daily.   Yes [provider]  gabapentin (NEURONTIN) 300 MG capsule Take 300 mg by mouth 2 (two) times daily.   Yes [provider]  insulin aspart (NOVOLOG) 100 UNIT/ML injection Inject 8-10 Units into the skin 3 (three) times daily with meals. Take 10 units in the morning- if blood  glucose is below 100, do not give; then take 8 units twice a day - if blood glucose is below 100, do not give  Sliding Scale: 100-149BG -0 units, 150-199BG -1 unit, 200-249BG -2units, 250-299BG -4units, 300-349BG -6units, 350-399BG -8units, 400BG+ notify MD   Yes [provider]  insulin glargine (LANTUS) 100 UNIT/ML injection Inject 12 Units into the skin at bedtime.   Yes [provider]  levothyroxine (SYNTHROID, LEVOTHROID) 137 MCG tablet Take 137 mcg by mouth daily before  breakfast.   Yes [provider]  losartan (COZAAR) 100 MG tablet Take 100 mg by mouth daily.   Yes [provider]  magnesium hydroxide (MILK OF MAGNESIA) 400 MG/5ML suspension Take 30 mLs by mouth daily as needed for mild constipation.   Yes [provider]  medroxyPROGESTERone (DEPO-PROVERA) 150 MG/ML injection Inject 150 mg into the muscle every 3 (three) months.   Yes [provider]  Multiple Vitamin (MULTIVITAMIN WITH MINERALS) TABS tablet Take 1 tablet by mouth daily.   Yes [provider]  Omega-3 Fatty Acids (OMEGA ESSENTIALS BASIC) LIQD Take 1,500 mg by mouth daily.   Yes [provider]  polyethylene glycol (MIRALAX / GLYCOLAX) packet Take 17 g by mouth every other day.   Yes [provider]  polyvinyl alcohol (LIQUIFILM TEARS) 1.4 % ophthalmic solution Place 1 drop into both eyes 2 (two) times daily.   Yes [provider]  potassium chloride (K-DUR,KLOR-CON) 10 MEQ tablet Take 10 mEq by mouth daily.   Yes [provider]  simvastatin (ZOCOR) 10 MG tablet Take 10 mg by mouth daily.   Yes [provider]  Vitamin D, Ergocalciferol, (DRISDOL) 1.25 MG (50000 UT) CAPS capsule Take 50,000 Units by mouth every 7 (seven) days.   Yes [provider]    Physical Exam: Vitals:   12/28/18 1200 12/28/18 1354 12/28/18 1500 12/28/18 1530  BP: 116/65 116/65 (!) 102/55 117/81  Pulse: (!) 106 (!) 107  89  Resp: 20 16 17 15   Temp:      TempSrc:      SpO2: 100% 97%  100%  Weight:      Height:        Constitutional: NAD, calm, comfortable Vitals:   12/28/18 1200 12/28/18 1354 12/28/18 1500 12/28/18 1530  BP: 116/65 116/65 (!) 102/55 117/81  Pulse: (!) 106 (!) 107  89  Resp: 20 16 17 15   Temp:      TempSrc:      SpO2: 100% 97%  100%  Weight:      Height:       Eyes: PERRL, lids and conjunctivae normal ENMT: Mucous membranes are moist. Posterior pharynx clear of any exudate or lesions.    Neck: normal, supple, no masses, no thyromegaly Respiratory: clear to auscultation bilaterally, no wheezing, no crackles. Normal respiratory effort. No accessory muscle use.  Anterior auscultation. Cardiovascular: Tachycardia, Regular rate and rhythm, no murmurs / rubs / gallops. No extremity edema. 2+ pedal pulses.   Abdomen: no tenderness, no masses palpated. No hepatosplenomegaly. Bowel sounds positive.  Wearing adult diapers. Musculoskeletal: no clubbing / cyanosis.  Flexion contractures to bilateral upper extremities, flexion contracture to right lower extremity. Skin: no rashes, lesions, ulcers. No induration Neurologic: Unable to fully assess due to patient's baseline dementia. Psychiatric: Alert, able to tell me her first name. But unable to verbally answer any other question.  Patient is nonverbal at baseline.  Labs on Admission: I have personally reviewed following labs and imaging studies  CBC: Recent Labs  Lab 12/28/18 1202  WBC 18.9*  NEUTROABS 16.0*  HGB 12.7  HCT 43.1  MCV 92.1  PLT 409*   Basic Metabolic Panel: Recent Labs  Lab 12/28/18 1202  NA 143  K 4.8  CL 108  CO2 7*  GLUCOSE 294*  BUN 51*  CREATININE 2.46*  CALCIUM 9.9   Liver Function Tests: Recent Labs  Lab 12/28/18 1202  AST 21  ALT 12  ALKPHOS 146*  BILITOT 1.4*  PROT 8.2*  ALBUMIN 4.2   CBG: Recent Labs  Lab 12/28/18 0955 12/28/18 1235  GLUCAP 390* 185*    Radiological Exams on Admission: Ct Abdomen Pelvis Wo Contrast  Result Date: 12/28/2018 CLINICAL DATA:  Hyperglycemia. EXAM: CT ABDOMEN AND PELVIS WITHOUT CONTRAST TECHNIQUE: Multidetector CT imaging of the abdomen and pelvis was performed following the standard protocol without IV contrast. COMPARISON:  None. FINDINGS: Lower chest: Calcific atherosclerotic disease of the coronary arteries. Moderate hiatal hernia. Hepatobiliary: No focal liver abnormality is seen. No gallstones, gallbladder wall thickening, or biliary dilatation.  Pancreas: Unremarkable. No pancreatic ductal dilatation or surrounding inflammatory changes. Spleen: Normal in size without focal abnormality. Adrenals/Urinary Tract: Adrenal glands are unremarkable. Kidneys are normal, without renal calculi, focal lesion, or hydronephrosis. Mild diffuse thickening of the urinary bladder wall. Stomach/Bowel: Stomach is within normal limits. Appendix appears normal. No evidence of bowel wall thickening, distention, or inflammatory changes. Vascular/Lymphatic: Aortic atherosclerosis. No enlarged abdominal or pelvic lymph nodes. Reproductive: Uterus and bilateral adnexa are unremarkable. Other: Small fat containing periumbilical hernia. No abdominopelvic ascites. Musculoskeletal: No acute or significant osseous findings. IMPRESSION: 1. No evidence of acute abnormality within the solid abdominal organs. 2. Mild diffuse thickening of the urinary bladder wall. Please correlate to urinalysis. 3. Calcific atherosclerotic disease of the coronary arteries and aorta. 4. Moderate hiatal hernia. Electronically Signed   By: Ted Mcalpine M.D.   On: 12/28/2018 14:39   Dg Chest Port 1 View  Result Date: 12/28/2018 CLINICAL DATA:  Hyperglycemia EXAM: PORTABLE CHEST 1 VIEW COMPARISON:  May 08, 2005 FINDINGS: There is no appreciable edema or consolidation. Heart size and pulmonary vascularity are normal. No adenopathy. No bone lesions. IMPRESSION: No edema or consolidation. Electronically Signed   By: Bretta Bang III M.D.   On: 12/28/2018 11:11    EKG: Independently reviewed.  Sinus tachycardia.  QTC- 473.  Assessment/Plan Active Problems:   Sepsis (HCC)   Sepsis-with tachycardia, leukocytosis 18.9, hypotension 95/66.  Marked lactic acidosis 6.8.  Chest x-ray negative for acute abnormality, abnormal CT done for elevated ALP-bowel wall thickening, otherwise negative for acute abnormality.  Sepsis likely of urinary source.  UA via in and out cath- large leukocytes, many  bacteria. Total 4L bolus given. -Continue broad-spectrum antibiotics with vancomycin, cefepime and metronidazole -Trend lactic acid 6.8 >>1.  -CMP, CBC a.m. -Follow-up blood and urine cultures  AKI-creatinine 2.46.,  Recent Cr on care everywhere 0.9 02/13/2018.  Likely secondary to sepsis.  - 4L bolus given in ED. - IVF 1/2 Ns 100cc/hr x 15hrs, later reduced to 75cc/hr - CMP a.m  Anion gap metabolic acidosis-bicarb 7, anion gap >20.  Likely combination of significant lactic acidosis 6.8 and azotemia. -CMP a.m. - Hydrate  Elevated ALP-4 6, total bili 1.4.  Abdominal CT negative for acute abnormality. - CMP a.m  Diabetes mellitus-random glucose 294. Hgba1c 11/2018- 6.7 -Resume home Lantus 12 units daily - SSI-s - D/c DKA protocol  Multi-infarct dementia, CVA. -At baseline patient is mostly nonverbal.  Patient alert.  Resident of nursing home.  Hypothyroidism -Resume home Synthroid  Patient is a poor stick so PICC line was placed in ED.  DVT prophylaxis: Heprain  Code Status: Full Family Communication: None at bedside Disposition Plan: per rounding team Consults called: None Admission status: Inpt, Stepdown I certify that at the point of admission it is my clinical judgment that the patient will require inpatient hospital care spanning beyond 2 midnights from the point of admission due to high intensity of service, high risk for further deterioration and high frequency of surveillance required. The following factors support the patient status of inpatient:  -Presentation with sepsis, with significant lactic acidosis -Requiring IV antibiotics, and awaiting blood and urine cultures results.   Onnie Boer MD [facility]  12/28/2018, 6:05 PM

## 2018-12-28 NOTE — Progress Notes (Signed)
Pharmacy Note:  Initial antibiotic(s) regimen of Vancomycin and Cefepime ordered by EDP to treat sepsis and unknown source of infection.  Estimated Creatinine Clearance: 32.4 mL/min (A) (by C-G formula based on SCr of 2.46 mg/dL (H)).   Allergies  Allergen Reactions  . Topiramate Nausea And Vomiting  . Clindamycin/Lincomycin Nausea And Vomiting  . Sulfamethoxazole-Trimethoprim Hives and Rash  . Bactericin [Bacitracin]   . Codeine Nausea And Vomiting  . Doxycycline     Vitals:   12/28/18 1030 12/28/18 1354  BP: 107/78 116/65  Pulse:  (!) 107  Resp: (!) 23 16  Temp:    SpO2:  97%    Anti-infectives (From admission, onward)   Start     Dose/Rate Route Frequency Ordered Stop   12/28/18 1300  vancomycin (VANCOCIN) IVPB 1000 mg/200 mL premix     1,000 mg 200 mL/hr over 60 Minutes Intravenous Every 1 hr x 2 12/28/18 1244 12/28/18 1555   12/28/18 1245  ceFEPIme (MAXIPIME) 2 g in sodium chloride 0.9 % 100 mL IVPB     2 g 200 mL/hr over 30 Minutes Intravenous  Once 12/28/18 1237 12/28/18 1353   12/28/18 1245  metroNIDAZOLE (FLAGYL) IVPB 500 mg     500 mg 100 mL/hr over 60 Minutes Intravenous  Once 12/28/18 1237 12/28/18 1454   12/28/18 1245  vancomycin (VANCOCIN) IVPB 1000 mg/200 mL premix  Status:  Discontinued     1,000 mg 200 mL/hr over 60 Minutes Intravenous  Once 12/28/18 1237 12/28/18 1244      Plan: Initial dose(s) of Cefepime 2gm IV and Vancomycin 2gm IV X 1 ordered. F/U admission orders for further dosing if therapy continued.  Elder Cyphers, BS Loura Back, [place] Clinical Pharmacist Pager [phone] 12/28/2018 3:16 PM

## 2018-12-28 NOTE — Progress Notes (Signed)
Peripherally Inserted Central Catheter/Midline Placement  The IV Nurse has discussed with the patient and/or persons authorized to consent for the patient, the purpose of this procedure and the potential benefits and risks involved with this procedure.  The benefits include less needle sticks, lab draws from the catheter, and the patient may be discharged home with the catheter. Risks include, but not limited to, infection, bleeding, blood clot (thrombus formation), and puncture of an artery; nerve damage and irregular heartbeat and possibility to perform a PICC exchange if needed/ordered by physician.  Alternatives to this procedure were also discussed.  Bard Power PICC patient education guide, fact sheet on infection prevention and patient information card has been provided to patient /or left at bedside.    PICC/Midline Placement Documentation     Consent obtained via phone with mother   Timmothy Sours 12/28/2018, 6:36 PM

## 2018-12-28 NOTE — Progress Notes (Signed)
Inpatient Diabetes Program Recommendations  AACE/ADA: New Consensus Statement on Inpatient Glycemic Control (2015)  Target Ranges:  Prepandial:   less than 140 mg/dL      Peak postprandial:   less than 180 mg/dL (1-2 hours)      Critically ill patients:  140 - 180 mg/dL   Lab Results  Component Value Date   GLUCAP 185 (H) 12/28/2018    Review of Glycemic Control  CO2 - 7 AG - > 20  MD - Please order Phase 1 DKA Order Set  Will follow.  Thank you. Ailene Ards, RD, LDN, CDE Inpatient Diabetes Coordinator [phone]

## 2018-12-28 NOTE — Progress Notes (Signed)
cbg 455 midlevel provider notified.

## 2018-12-29 LAB — CBC
HCT: 31.9 % — ABNORMAL LOW (ref 36.0–46.0)
Hemoglobin: 10.2 g/dL — ABNORMAL LOW (ref 12.0–15.0)
MCH: 27.3 pg (ref 26.0–34.0)
MCHC: 32 g/dL (ref 30.0–36.0)
MCV: 85.3 fL (ref 80.0–100.0)
NRBC: 0 % (ref 0.0–0.2)
Platelets: 342 10*3/uL (ref 150–400)
RBC: 3.74 MIL/uL — AB (ref 3.87–5.11)
RDW: 15.6 % — ABNORMAL HIGH (ref 11.5–15.5)
WBC: 14.8 10*3/uL — ABNORMAL HIGH (ref 4.0–10.5)

## 2018-12-29 LAB — COMPREHENSIVE METABOLIC PANEL
ALK PHOS: 102 U/L (ref 38–126)
ALT: 10 U/L (ref 0–44)
AST: 11 U/L — AB (ref 15–41)
Albumin: 3.1 g/dL — ABNORMAL LOW (ref 3.5–5.0)
Anion gap: 9 (ref 5–15)
BUN: 35 mg/dL — AB (ref 6–20)
CO2: 17 mmol/L — ABNORMAL LOW (ref 22–32)
Calcium: 8.7 mg/dL — ABNORMAL LOW (ref 8.9–10.3)
Chloride: 119 mmol/L — ABNORMAL HIGH (ref 98–111)
Creatinine, Ser: 1.27 mg/dL — ABNORMAL HIGH (ref 0.44–1.00)
GFR calc Af Amer: 58 mL/min — ABNORMAL LOW (ref >60)
GFR calc non Af Amer: 50 mL/min — ABNORMAL LOW (ref >60)
Glucose, Bld: 97 mg/dL (ref 70–99)
Potassium: 3.9 mmol/L (ref 3.5–5.1)
Sodium: 145 mmol/L (ref 135–145)
Total Bilirubin: 0.8 mg/dL (ref 0.3–1.2)
Total Protein: 6.1 g/dL — ABNORMAL LOW (ref 6.5–8.1)

## 2018-12-29 LAB — GLUCOSE, CAPILLARY
Glucose-Capillary: 93 mg/dL (ref 70–99)
Glucose-Capillary: 148 mg/dL — ABNORMAL HIGH (ref 70–99)
Glucose-Capillary: 179 mg/dL — ABNORMAL HIGH (ref 70–99)
Glucose-Capillary: 133 mg/dL — ABNORMAL HIGH (ref 70–99)

## 2018-12-29 LAB — URINE CULTURE: Culture: <10000 — AB

## 2018-12-29 LAB — MRSA PCR SCREENING: MRSA by PCR: NEGATIVE

## 2018-12-29 MED ORDER — VANCOMYCIN HCL IN DEXTROSE 1-5 GM/200ML-% IV SOLN
1000.0000 mg | Freq: Two times a day (BID) | INTRAVENOUS | Status: DC
  Administered 2018-12-29 (×2): 1000 mg via INTRAVENOUS
  Filled 2018-12-29 (×2): qty 200

## 2018-12-29 MED ORDER — SODIUM CHLORIDE 0.9 % IV SOLN
2.0000 g | Freq: Three times a day (TID) | INTRAVENOUS | Status: DC
  Administered 2018-12-29 – 2018-12-30 (×4): 2 g via INTRAVENOUS
  Filled 2018-12-29 (×4): qty 2

## 2018-12-29 MED ORDER — INSULIN ASPART 100 UNIT/ML ~~LOC~~ SOLN: 0.0000 [IU] | Freq: Every day | SUBCUTANEOUS | Status: DC

## 2018-12-29 MED ORDER — INSULIN GLARGINE 100 UNIT/ML ~~LOC~~ SOLN
8.0000 [IU] | Freq: Two times a day (BID) | SUBCUTANEOUS | Status: DC
  Administered 2018-12-29 – 2018-12-30 (×3): 8 [IU] via SUBCUTANEOUS
  Filled 2018-12-29 (×5): qty 0.08

## 2018-12-29 MED ORDER — INSULIN ASPART 100 UNIT/ML ~~LOC~~ SOLN
0.0000 [IU] | Freq: Three times a day (TID) | SUBCUTANEOUS | Status: DC
  Administered 2018-12-29: 2 [IU] via SUBCUTANEOUS
  Administered 2018-12-29: 3 [IU] via SUBCUTANEOUS

## 2018-12-29 NOTE — NC FL2 (Signed)
[place] MEDICAID FL2 LEVEL OF CARE SCREENING TOOL     IDENTIFICATION  Patient Name: [name] Birthdate: 07/25/1971 Sex: female Admission Date (Current Location): 12/28/2018  [facility] and [place] Number:  [employer] and Address:  [facility],  [address] [place] [address]      Provider Number: [id number]  Attending Physician Name and Address:  Erick Blinks, DO  Relative Name and Phone Number:       Current Level of Care: Hospital Recommended Level of Care: Skilled Nursing Facility Prior Approval Number:    Date Approved/Denied:   PASRR Number:    Discharge Plan: SNF    Current Diagnoses: Patient Active Problem List   Diagnosis Date Noted  . Sepsis (HCC) 12/28/2018  . 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 Dementia, multiinfarct (HCC) 07/14/2015  . Stage 3 chronic renal impairment associated with type 2 diabetes mellitus (HCC) 07/14/2015  . Carpal tunnel syndrome 07/14/2015    Orientation RESPIRATION BLADDER Height & Weight     Self  O2(PRN) Incontinent Weight: 199 lb 1.2 oz (90.3 kg) Height:  5\' 6"  (167.6 cm)  BEHAVIORAL SYMPTOMS/MOOD NEUROLOGICAL BOWEL NUTRITION STATUS      Incontinent Diet(no added salt, no concentrated sweets)  AMBULATORY STATUS COMMUNICATION OF NEEDS Skin   Total Care Non-Verbally Normal                       Personal Care Assistance Level of Assistance  Bathing, Feeding, Dressing, Total care           Functional Limitations Info  Sight, Hearing, Speech Sight Info: Adequate Hearing Info: Adequate Speech Info: Impaired    SPECIAL CARE FACTORS FREQUENCY                       Contractures Contractures Info: Not present    Additional Factors Info  Code Status, Allergies Code Status Info: full             Current Medications (12/29/2018):  This is the current hospital active medication list Current Facility-Administered Medications  Medication Dose  Route Frequency Provider Last Rate Last Dose  . 0.45 % sodium chloride infusion   Intravenous Continuous Emokpae, Ejiroghene E, MD 75 mL/hr at 12/28/18 2037    . allopurinol (ZYLOPRIM) tablet 150 mg  150 mg Oral Daily Emokpae, Ejiroghene E, MD   150 mg at 12/29/18 1050  . aspirin chewable tablet 81 mg  81 mg Oral Daily Emokpae, Ejiroghene E, MD   81 mg at 12/29/18 1050  . ceFEPIme (MAXIPIME) 2 g in sodium chloride 0.9 % 100 mL IVPB  2 g Intravenous Q8H Shah, Pratik D, DO 200 mL/hr at 12/29/18 0951 2 g at 12/29/18 0951  . Chlorhexidine Gluconate Cloth 2 % PADS 6 each  6 each Topical Q0600 Emokpae, Ejiroghene E, MD   6 each at 12/29/18 0525  . citalopram (CELEXA) tablet 20 mg  20 mg Oral Daily Emokpae, Ejiroghene E, MD   20 mg at 12/29/18 1050  . clopidogrel (PLAVIX) tablet 75 mg  75 mg Oral Daily Emokpae, Ejiroghene E, MD   75 mg at 12/29/18 1050  . ezetimibe (ZETIA) tablet 10 mg  10 mg Oral QHS Emokpae, Ejiroghene E, MD   10 mg at 12/28/18 2131  . fluticasone (FLONASE) 50 MCG/ACT nasal spray 1 spray  1 spray Each Nare Daily Emokpae, Ejiroghene E, MD   1 spray at 12/29/18 1050  .  heparin injection 5,000 Units  5,000 Units Subcutaneous Q8H Emokpae, Ejiroghene E, MD   5,000 Units at 12/29/18 0525  . insulin aspart (novoLOG) injection 0-15 Units  0-15 Units Subcutaneous TID WC Shah, Pratik D, DO      . insulin aspart (novoLOG) injection 0-5 Units  0-5 Units Subcutaneous QHS Shah, Pratik D, DO      . insulin glargine (LANTUS) injection 8 Units  8 Units Subcutaneous BID Maurilio Lovely D, DO   8 Units at 12/29/18 1049  . levothyroxine (SYNTHROID, LEVOTHROID) tablet 137 mcg  137 mcg Oral Q0600 Emokpae, Ejiroghene E, MD   137 mcg at 12/29/18 0525  . metroNIDAZOLE (FLAGYL) IVPB 500 mg  500 mg Intravenous Q8H Emokpae, Ejiroghene E, MD 100 mL/hr at 12/29/18 0536 500 mg at 12/29/18 0536  . simvastatin (ZOCOR) tablet 10 mg  10 mg Oral q1800 Emokpae, Ejiroghene E, MD   10 mg at 12/28/18 2134  . sodium chloride flush  (NS) 0.9 % injection 10-40 mL  10-40 mL Intracatheter Q12H Emokpae, Ejiroghene E, MD   10 mL at 12/29/18 1050  . sodium chloride flush (NS) 0.9 % injection 10-40 mL  10-40 mL Intracatheter PRN Emokpae, Ejiroghene E, MD      . vancomycin (VANCOCIN) IVPB 1000 mg/200 mL premix  1,000 mg Intravenous Q12H Shah, Pratik D, DO 200 mL/hr at 12/29/18 1051 1,000 mg at 12/29/18 1051     Discharge Medications: Please see discharge summary for a list of discharge medications.  Relevant Imaging Results:  Relevant Lab Results:   Additional Information    Elliot Gault, LCSW

## 2018-12-29 NOTE — TOC Initial Note (Signed)
Transition of Care [facility]) - Initial/Assessment Note    Patient Details  Name: [name] MRN: [id number] Date of Birth: [birth year]  Transition of Care [facility] CM/SW Contact:    Elliot Gault, LCSW Phone Number: 12/29/2018, 11:24 AM  Clinical Narrative:                 Pt admitted from [facility] where she has been in long term care since 2016. Griselda Miner at [facility] today. Per Dorathy Daft, pt's mother is very involved and supportive to pt. Pt's mother is her legal guardian. Plan is for return to [facility] at [employer]. Will follow.  Expected Discharge Plan: Long Term Nursing Home Barriers to Discharge: Continued Medical Work up   Patient Goals and CMS Choice        Expected Discharge Plan and Services Expected Discharge Plan: Long Term Nursing Home       Living arrangements for the past 2 months: Skilled Nursing Facility Expected Discharge Date: 12/31/18                        Prior Living Arrangements/Services Living arrangements for the past 2 months: Skilled Nursing Facility Lives with:: Facility Resident   Do you feel safe going back to the place where you live?: Yes      Need for Family Participation in Patient Care: No (Comment) Care giver support system in place?: Yes (comment)   Criminal Activity/Legal Involvement Pertinent to Current Situation/Hospitalization: No - Comment as needed  Activities of Daily Living Home Assistive Devices/Equipment: None ADL Screening (condition at time of admission) Patient's cognitive ability adequate to safely complete daily activities?: No Is the patient deaf or have difficulty hearing?: No Does the patient have difficulty seeing, even when wearing glasses/contacts?: No Does the patient have difficulty concentrating, remembering, or making decisions?: No Patient able to express need for assistance with ADLs?: No Does the patient have difficulty dressing or bathing?: Yes Independently performs ADLs?:  No Communication: Needs assistance Is this a change from baseline?: Pre-admission baseline Dressing (OT): Dependent Is this a change from baseline?: Pre-admission baseline Grooming: Dependent Is this a change from baseline?: Pre-admission baseline Feeding: Dependent Is this a change from baseline?: Pre-admission baseline Bathing: Dependent Is this a change from baseline?: Pre-admission baseline Toileting: Dependent Is this a change from baseline?: Pre-admission baseline In/Out Bed: Dependent Is this a change from baseline?: Pre-admission baseline Walks in Home: Dependent Is this a change from baseline?: Pre-admission baseline Does the patient have difficulty walking or climbing stairs?: Yes Weakness of Legs: Both Weakness of Arms/Hands: Right  Permission Sought/Granted                  Emotional Assessment Appearance:: Appears stated age     Orientation: : Oriented to Self Alcohol / Substance Use: Not Applicable Psych Involvement: No (comment)  Admission diagnosis:  Hyperglycemia [R73.9] Sepsis, due to unspecified organism, unspecified whether acute organ dysfunction present [facility]) [A41.9] Patient Active Problem List   Diagnosis Date Noted  . Sepsis (HCC) 12/28/2018  . 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 Dementia, multiinfarct (HCC) 07/14/2015  . Stage 3 chronic renal impairment associated with type 2 diabetes mellitus (HCC) 07/14/2015  . Carpal tunnel syndrome 07/14/2015   PCP:  Patient, No Pcp Per Pharmacy:  No Pharmacies Listed    Social Determinants of Health (SDOH) Interventions    Readmission Risk Interventions No flowsheet data found.

## 2018-12-29 NOTE — Progress Notes (Signed)
PROGRESS NOTE    Jessica BlalockSimone D Hang  ZOX:[id number] DOB: 02/22/1971 DOA: 12/28/2018 PCP: Patient, No Pcp Per   Brief Narrative:  Per HPI: [name] is a 48 y.o. female with medical history significant for DM, multi-infarct dementia, CKD 3, who was [facility] the [facility] with reports of high blood sugar. Patient answers very few questions, she tells me her first name, shakes her head to deny difficulty breathing or abdominal pain. She doesn't answer further questions.   Patient was admitted with sepsis, possibly a UTI and started on antibiotics to include vancomycin, cefepime, and Flagyl.  She has had a downward trend and lactic acidosis noted with cultures pending.  Assessment & Plan:   Active Problems:   Sepsis (HCC)   1. Sepsis secondary to UTI.  Continue to monitor cultures and maintain on current antibiotics with vancomycin, cefepime, and metronidazole.  Trend lactic acid repeat in a.m. and follow CBC, both of which appear to be improving.  Maintain on IV fluid as ordered. 2. AKI secondary to above.  Already with vast improvement noted with fluid resuscitation.  Continue to maintain on IV fluid as ordered and monitor repeat BMP in a.m.  Avoid nephrotoxic agents. 3. Type 2 diabetes with mild hyperglycemia.  Adjust Lantus to 8 units twice daily and increase sliding scale to moderate.  Continue to monitor on carb modified diet. 4. CVA with multi-infarct dementia.  Patient is generally nonverbal and resident of a nursing home.  Anticipate discharge to facility in the next 48 hours once culture results are obtained and patient is improved. 5. Hypothyroidism.  Continue home Synthroid.   DVT prophylaxis: Heparin Code Status: Full Family Communication: None at bedside Disposition Plan: Continue treatment of sepsis with ongoing IV antibiotics and fluids and monitor cultures.   Consultants:   None  Procedures:   None  Antimicrobials:   Vancomycin,  cefepime, and Flagyl 3/24->   Subjective: Patient seen and evaluated today with no new acute complaints or concerns. No acute concerns or events noted overnight.  She is nonverbal, but nursing staff denies any concerns overnight.  Laboratory data appear to be improving.  Patient appears to have good urine output in Foley bag.  Objective: Vitals:   12/29/18 0200 12/29/18 0300 12/29/18 0400 12/29/18 0500  BP: 127/72 119/68 122/61 139/71  Pulse:      Resp: 16 16 17 17   Temp:   98.6 F (37 C)   TempSrc:   Oral   SpO2:      Weight:    90.3 kg  Height:        Intake/Output Summary (Last 24 hours) at 12/29/2018 0734 Last data filed at 12/29/2018 0539 Gross per 24 hour  Intake 343.56 ml  Output 2250 ml  Net -1906.44 ml   Filed Weights   12/28/18 0950 12/28/18 2002 12/29/18 0500  Weight: 94.3 kg 91.3 kg 90.3 kg    Examination:  General exam: Appears calm and comfortable  Respiratory system: Clear to auscultation. Respiratory effort normal. Cardiovascular system: S1 & S2 heard, RRR. No JVD, murmurs, rubs, gallops or clicks. No pedal edema. Gastrointestinal system: Abdomen is nondistended, soft and nontender. No organomegaly or masses felt. Normal bowel sounds heard. Central nervous system: Alert and oriented. No focal neurological deficits. Extremities: Symmetric 5 x 5 power. Skin: No rashes, lesions or ulcers Psychiatry: Judgement and insight appear normal. Mood & affect appropriate.     Data Reviewed: I have personally reviewed following labs and imaging  studies  CBC: Recent Labs  Lab 12/28/18 1202 12/29/18 0452  WBC 18.9* 14.8*  NEUTROABS 16.0*  --   HGB 12.7 10.2*  HCT 43.1 31.9*  MCV 92.1 85.3  PLT 409* 342   Basic Metabolic Panel: Recent Labs  Lab 12/28/18 1202 12/29/18 0452  NA 143 145  K 4.8 3.9  CL 108 119*  CO2 7* 17*  GLUCOSE 294* 97  BUN 51* 35*  CREATININE 2.46* 1.27*  CALCIUM 9.9 8.7*   GFR: Estimated Creatinine Clearance: 61.3 mL/min (A)  (by C-G formula based on SCr of 1.27 mg/dL (H)). Liver Function Tests: Recent Labs  Lab 12/28/18 1202 12/29/18 0452  AST 21 11*  ALT 12 10  ALKPHOS 146* 102  BILITOT 1.4* 0.8  PROT 8.2* 6.1*  ALBUMIN 4.2 3.1*   No results for input(s): LIPASE, AMYLASE in the last 168 hours. No results for input(s): AMMONIA in the last 168 hours. Coagulation Profile: No results for input(s): INR, PROTIME in the last 168 hours. Cardiac Enzymes: No results for input(s): CKTOTAL, CKMB, CKMBINDEX, TROPONINI in the last 168 hours. BNP (last 3 results) No results for input(s): PROBNP in the last 8760 hours. HbA1C: No results for input(s): HGBA1C in the last 72 hours. CBG: Recent Labs  Lab 12/28/18 0955 12/28/18 1235 12/28/18 2035  GLUCAP 390* 185* 455*   Lipid Profile: No results for input(s): CHOL, HDL, LDLCALC, TRIG, CHOLHDL, LDLDIRECT in the last 72 hours. Thyroid Function Tests: No results for input(s): TSH, T4TOTAL, FREET4, T3FREE, THYROIDAB in the last 72 hours. Anemia Panel: No results for input(s): VITAMINB12, FOLATE, FERRITIN, TIBC, IRON, RETICCTPCT in the last 72 hours. Sepsis Labs: Recent Labs  Lab 12/28/18 1202 12/28/18 1850  LATICACIDVEN 6.5* 1.0    Recent Results (from the past 240 hour(s))  Blood Culture (routine x 2)     Status: None (Preliminary result)   Collection Time: 12/28/18  6:50 PM  Result Value Ref Range Status   Specimen Description PORTA CATH  Final   Special Requests   Final    BOTTLES DRAWN AEROBIC AND ANAEROBIC Blood Culture adequate volume   Culture   Final    NO GROWTH < 12 HOURS Performed at [facility], [address], [place], [place] [address]    Report Status PENDING  Incomplete  MRSA PCR Screening     Status: None   Collection Time: 12/28/18  7:08 PM  Result Value Ref Range Status   MRSA by PCR NEGATIVE NEGATIVE Final    Comment:        The GeneXpert MRSA Assay (FDA approved for NASAL specimens only), is one component of a comprehensive  MRSA colonization surveillance program. It is not intended to diagnose MRSA infection nor to guide or monitor treatment for MRSA infections. Performed at [facility], [address] [place], [place] [address]          Radiology Studies: Ct Abdomen Pelvis Wo Contrast  Result Date: 12/28/2018 CLINICAL DATA:  Hyperglycemia. EXAM: CT ABDOMEN AND PELVIS WITHOUT CONTRAST TECHNIQUE: Multidetector CT imaging of the abdomen and pelvis was performed following the standard protocol without IV contrast. COMPARISON:  None. FINDINGS: Lower chest: Calcific atherosclerotic disease of the coronary arteries. Moderate hiatal hernia. Hepatobiliary: No focal liver abnormality is seen. No gallstones, gallbladder wall thickening, or biliary dilatation. Pancreas: Unremarkable. No pancreatic ductal dilatation or surrounding inflammatory changes. Spleen: Normal in size without focal abnormality. Adrenals/Urinary Tract: Adrenal glands are unremarkable. Kidneys are normal, without renal calculi, focal lesion, or hydronephrosis. Mild  diffuse thickening of the urinary bladder wall. Stomach/Bowel: Stomach is within normal limits. Appendix appears normal. No evidence of bowel wall thickening, distention, or inflammatory changes. Vascular/Lymphatic: Aortic atherosclerosis. No enlarged abdominal or pelvic lymph nodes. Reproductive: Uterus and bilateral adnexa are unremarkable. Other: Small fat containing periumbilical hernia. No abdominopelvic ascites. Musculoskeletal: No acute or significant osseous findings. IMPRESSION: 1. No evidence of acute abnormality within the solid abdominal organs. 2. Mild diffuse thickening of the urinary bladder wall. Please correlate to urinalysis. 3. Calcific atherosclerotic disease of the coronary arteries and aorta. 4. Moderate hiatal hernia. Electronically Signed   By: Ted Mcalpine M.D.   On: 12/28/2018 14:39   Dg Chest Port 1 View  Result Date: 12/28/2018 CLINICAL DATA:  Hyperglycemia  EXAM: PORTABLE CHEST 1 VIEW COMPARISON:  May 08, 2005 FINDINGS: There is no appreciable edema or consolidation. Heart size and pulmonary vascularity are normal. No adenopathy. No bone lesions. IMPRESSION: No edema or consolidation. Electronically Signed   By: Bretta Bang III M.D.   On: 12/28/2018 11:11   [place] Ekg Site Rite  Result Date: 12/28/2018 If Site Rite image not attached, placement could not be confirmed due to current cardiac rhythm.     Scheduled Meds: . allopurinol  150 mg Oral Daily  . aspirin  81 mg Oral Daily  . Chlorhexidine Gluconate Cloth  6 each Topical Q0600  . citalopram  20 mg Oral Daily  . clopidogrel  75 mg Oral Daily  . ezetimibe  10 mg Oral QHS  . fluticasone  1 spray Each Nare Daily  . heparin  5,000 Units Subcutaneous Q8H  . insulin aspart  0-9 Units Subcutaneous TID WC  . insulin glargine  12 Units Subcutaneous QHS  . levothyroxine  137 mcg Oral Q0600  . simvastatin  10 mg Oral q1800  . sodium chloride flush  10-40 mL Intracatheter Q12H   Continuous Infusions: . sodium chloride 75 mL/hr at 12/28/18 2037  . ceFEPime (MAXIPIME) IV    . metronidazole 500 mg (12/29/18 0536)  . vancomycin       LOS: 1 day    Time spent: 30 minutes    Tatianna Ibbotson Hoover Brunette, DO [facility] Pager [phone]  If 7PM-7AM, please contact night-coverage [email or web address] Password [facility] 12/29/2018, 7:34 AM

## 2018-12-29 NOTE — Progress Notes (Signed)
Pharmacy Antibiotic Note  [name] is a 48 y.o. female admitted on 12/28/2018 with sepsis/ infection- unknown source.  Pharmacy has been consulted for Vancomycin and Cefepime dosing. CrCl improved, adjust antibiotics  Plan: Change Vancomycin 1000 mg IV every 12 hours.  Goal trough 15-20 mcg/mL. Increase Cefepime 2000 mg IV every 8 hours. Monitor labs, c/s, and vanco levels as indicated.  Height: 5\' 6"  (167.6 cm) Weight: 199 lb 1.2 oz (90.3 kg) IBW/kg (Calculated) : 59.3  Temp (24hrs), Avg:98.2 F (36.8 C), Min:97.8 F (36.6 C), Max:98.6 F (37 C)  Recent Labs  Lab 12/28/18 1202 12/28/18 1850 12/29/18 0452  WBC 18.9*  --  14.8*  CREATININE 2.46*  --  1.27*  LATICACIDVEN 6.5* 1.0  --     Estimated Creatinine Clearance: 61.3 mL/min (A) (by C-G formula based on SCr of 1.27 mg/dL (H)).    Allergies  Allergen Reactions  . Topiramate Nausea And Vomiting  . Clindamycin/Lincomycin Nausea And Vomiting  . Sulfamethoxazole-Trimethoprim Hives and Rash  . Bactericin [Bacitracin]   . Codeine Nausea And Vomiting  . Doxycycline     Antimicrobials this admission: Vanco 3/24 >>  Cefepime 3/24 >>   Dose adjustments this admission: 3/25Vanco/Cefepime  Microbiology results: 3/24 BCx: ng in 12 hours 3/24 UCx:  Pending 3/24 MRSA PCR is negative    Thank you for allowing pharmacy to be a part of this patient's care.  Elder Cyphers, BS Loura Back, [place] Clinical Pharmacist Pager [phone] 12/29/2018 8:39 AM

## 2018-12-29 NOTE — Progress Notes (Signed)
Inpatient Diabetes Program Recommendations  AACE/ADA: New Consensus Statement on Inpatient Glycemic Control (2015)  Target Ranges:  Prepandial:   less than 140 mg/dL      Peak postprandial:   less than 180 mg/dL (1-2 hours)      Critically ill patients:  140 - 180 mg/dL   Lab Results  Component Value Date   GLUCAP 93 12/29/2018    Review of Glycemic Control  Blood sugars much improved. Since Type 1 and makes no insulin, will need meal coverage.  Inpatient Diabetes Program Recommendations:     Add Novolog 3 units tidwc for meal coverage insulin if pt eats > 50% meal. Reduce Novolog to 0-9 units tidwc and hs, since Type 1.  Will continue to follow.  Thank you. Ailene Ards, RD, LDN, CDE Inpatient Diabetes Coordinator [phone]

## 2018-12-30 LAB — COMPREHENSIVE METABOLIC PANEL
ALT: 13 U/L (ref 0–44)
AST: 13 U/L — ABNORMAL LOW (ref 15–41)
Albumin: 3 g/dL — ABNORMAL LOW (ref 3.5–5.0)
Alkaline Phosphatase: 95 U/L (ref 38–126)
Anion gap: 10 (ref 5–15)
BUN: 18 mg/dL (ref 6–20)
CO2: 19 mmol/L — ABNORMAL LOW (ref 22–32)
Calcium: 8.7 mg/dL — ABNORMAL LOW (ref 8.9–10.3)
Chloride: 115 mmol/L — ABNORMAL HIGH (ref 98–111)
Creatinine, Ser: 0.9 mg/dL (ref 0.44–1.00)
GFR calc Af Amer: >60 mL/min (ref >60)
GFR calc non Af Amer: >60 mL/min (ref >60)
Glucose, Bld: 260 mg/dL — ABNORMAL HIGH (ref 70–99)
Potassium: 3.4 mmol/L — ABNORMAL LOW (ref 3.5–5.1)
SODIUM: 144 mmol/L (ref 135–145)
Total Bilirubin: 0.6 mg/dL (ref 0.3–1.2)
Total Protein: 5.9 g/dL — ABNORMAL LOW (ref 6.5–8.1)

## 2018-12-30 LAB — CBC
HCT: 33.9 % — ABNORMAL LOW (ref 36.0–46.0)
Hemoglobin: 10.6 g/dL — ABNORMAL LOW (ref 12.0–15.0)
MCH: 26.9 pg (ref 26.0–34.0)
MCHC: 31.3 g/dL (ref 30.0–36.0)
MCV: 86 fL (ref 80.0–100.0)
Platelets: 320 10*3/uL (ref 150–400)
RBC: 3.94 MIL/uL (ref 3.87–5.11)
RDW: 15.8 % — AB (ref 11.5–15.5)
WBC: 9.2 10*3/uL (ref 4.0–10.5)
nRBC: 0 % (ref 0.0–0.2)

## 2018-12-30 LAB — HIV ANTIBODY (ROUTINE TESTING W REFLEX): HIV Screen 4th Generation wRfx: NONREACTIVE

## 2018-12-30 LAB — LACTIC ACID, PLASMA: Lactic Acid, Venous: 0.8 mmol/L (ref 0.5–1.9)

## 2018-12-30 LAB — GLUCOSE, CAPILLARY: Glucose-Capillary: 262 mg/dL — ABNORMAL HIGH (ref 70–99)

## 2018-12-30 MED ORDER — FOSFOMYCIN TROMETHAMINE 3 G PO PACK: 3.0000 g | PACK | Freq: Once | ORAL | 0 refills | Status: AC

## 2018-12-30 MED ORDER — INSULIN GLARGINE 100 UNIT/ML ~~LOC~~ SOLN: 8.0000 [IU] | Freq: Two times a day (BID) | SUBCUTANEOUS | 11 refills | Status: AC

## 2018-12-30 MED ORDER — POTASSIUM CHLORIDE CRYS ER 20 MEQ PO TBCR
40.0000 meq | EXTENDED_RELEASE_TABLET | Freq: Once | ORAL | Status: AC
  Administered 2018-12-30: 40 meq via ORAL
  Filled 2018-12-30: qty 2

## 2018-12-30 MED ORDER — FOSFOMYCIN TROMETHAMINE 3 G PO PACK
3.0000 g | PACK | Freq: Once | ORAL | Status: AC
  Administered 2018-12-30: 3 g via ORAL
  Filled 2018-12-30: qty 3

## 2018-12-30 NOTE — Discharge Summary (Signed)
Physician Discharge Summary  [name] GBE:[id number] DOB: 11-13-1970 DOA: 12/28/2018  PCP: Patient, No Pcp Per  Admit date: 12/28/2018  Discharge date: 12/30/2018  Admitted From:SNF  Disposition:  SNF  Recommendations for Outpatient Follow-up:  1. Follow up with PCP in 1-2 weeks 2. Was given a dose of fosfomycin on day of discharge and will require repeat on 3/29 as prescribed to complete course of treatment. 3. Continue insulin regimen with meals as previously prescribed with sliding scale.  Long-acting insulin modified to 8 units twice daily.  Home Health: None  Equipment/Devices: None  Discharge Condition: Stable  CODE STATUS: Full  Diet recommendation: Heart Healthy/carb modified  Brief/Interim Summary: Per HPI: [name] a 48 y.o.femalewith medical history significant forDM, multi-infarct dementia,CKD 3,who was brought to the ED from [facility] nursing home with reports of high blood sugar. Patient answers very few questions, she tells me her first name, shakes her head to deny difficulty breathing or abdominal pain. She doesn't answer further questions.  Patient was admitted with hyperglycemia and sepsis, possibly a UTI and started on antibiotics to include vancomycin, cefepime, and Flagyl.    She has had a downward trend in her white cell count as well as lactic acidosis with significant improvement noted.  She no longer appears to have any acute symptomatology and has otherwise been doing well with no other acute events during this hospitalization.  Her blood glucose levels have improved with adjustment of her long-acting insulin to 8 units twice daily.  Additionally, she appears to do well with her mealtime insulins as previously prescribed.  Urine culture did not demonstrate much growth, but urine analysis appeared to demonstrate signs of UTI.  Will discontinue all other antibiotics at this point and administer 1 dose of fosfomycin with repeat dose  scheduled in 3 days which should complete her course of treatment.  She is stable for discharge back to her skilled nursing facility at this point.  Discharge Diagnoses:  Active Problems:   Sepsis (HCC)  Principal discharge diagnosis: Hyperglycemia secondary to sepsis with UTI.  Discharge Instructions  Discharge Instructions    Diet - low sodium heart healthy   Complete by:  As directed    Increase activity slowly   Complete by:  As directed      Allergies as of 12/30/2018      Reactions   Topiramate Nausea And Vomiting   Clindamycin/lincomycin Nausea And Vomiting   Sulfamethoxazole-trimethoprim Hives, Rash   Bactericin [bacitracin]    Codeine Nausea And Vomiting   Doxycycline       Medication List    TAKE these medications   acetaminophen 325 MG tablet Commonly known as:  TYLENOL Take 650 mg by mouth every 4 (four) hours as needed.   allopurinol 300 MG tablet Commonly known as:  ZYLOPRIM Take 150 mg by mouth daily.   amLODipine 5 MG tablet Commonly known as:  NORVASC Take 5 mg by mouth daily.   aspirin 81 MG chewable tablet Chew 81 mg by mouth daily.   clopidogrel 75 MG tablet Commonly known as:  PLAVIX Take 75 mg by mouth daily.   escitalopram 5 MG tablet Commonly known as:  LEXAPRO Take 5 mg by mouth daily.   ezetimibe 10 MG tablet Commonly known as:  ZETIA Take 10 mg by mouth daily.   fluticasone 50 MCG/ACT nasal spray Commonly known as:  FLONASE Place 1 spray into both nostrils daily.   folic acid 1 MG tablet Commonly known as:  FOLVITE Take 1 mg by mouth daily.   fosfomycin 3 g Pack Commonly known as:  MONUROL Take 3 g by mouth once for 1 dose. Start taking on:  January 02, 2019   furosemide 20 MG tablet Commonly known as:  LASIX Take 20 mg by mouth daily.   gabapentin 300 MG capsule Commonly known as:  NEURONTIN Take 300 mg by mouth 2 (two) times daily.   insulin aspart 100 UNIT/ML injection Commonly known as:  novoLOG Inject 8-10  Units into the skin 3 (three) times daily with meals. Take 10 units in the morning- if blood glucose is below 100, do not give; then take 8 units twice a day - if blood glucose is below 100, do not give  Sliding Scale: 100-149BG -0 units, 150-199BG -1 unit, 200-249BG -2units, 250-299BG -4units, 300-349BG -6units, 350-399BG -8units, 400BG+ notify MD   insulin glargine 100 UNIT/ML injection Commonly known as:  LANTUS Inject 0.08 mLs (8 Units total) into the skin 2 (two) times daily. What changed:    how much to take  when to take this   levothyroxine 137 MCG tablet Commonly known as:  SYNTHROID, LEVOTHROID Take 137 mcg by mouth daily before breakfast.   losartan 100 MG tablet Commonly known as:  COZAAR Take 100 mg by mouth daily.   magnesium hydroxide 400 MG/5ML suspension Commonly known as:  MILK OF MAGNESIA Take 30 mLs by mouth daily as needed for mild constipation.   medroxyPROGESTERone 150 MG/ML injection Commonly known as:  DEPO-PROVERA Inject 150 mg into the muscle every 3 (three) months.   multivitamin with minerals Tabs tablet Take 1 tablet by mouth daily.   Omega Essentials Basic Liqd Take 1,500 mg by mouth daily.   polyethylene glycol packet Commonly known as:  MIRALAX / GLYCOLAX Take 17 g by mouth every other day.   polyvinyl alcohol 1.4 % ophthalmic solution Commonly known as:  LIQUIFILM TEARS Place 1 drop into both eyes 2 (two) times daily.   potassium chloride 10 MEQ tablet Commonly known as:  K-DUR,KLOR-CON Take 10 mEq by mouth daily.   simvastatin 10 MG tablet Commonly known as:  ZOCOR Take 10 mg by mouth daily.   Vitamin D (Ergocalciferol) 1.25 MG (50000 UT) Caps capsule Commonly known as:  DRISDOL Take 50,000 Units by mouth every 7 (seven) days.       Allergies  Allergen Reactions  . Topiramate Nausea And Vomiting  . Clindamycin/Lincomycin Nausea And Vomiting  . Sulfamethoxazole-Trimethoprim Hives and Rash  . Bactericin [Bacitracin]   .  Codeine Nausea And Vomiting  . Doxycycline     Consultations:  None   Procedures/Studies: Ct Abdomen Pelvis Wo Contrast  Result Date: 12/28/2018 CLINICAL DATA:  Hyperglycemia. EXAM: CT ABDOMEN AND PELVIS WITHOUT CONTRAST TECHNIQUE: Multidetector CT imaging of the abdomen and pelvis was performed following the standard protocol without IV contrast. COMPARISON:  None. FINDINGS: Lower chest: Calcific atherosclerotic disease of the coronary arteries. Moderate hiatal hernia. Hepatobiliary: No focal liver abnormality is seen. No gallstones, gallbladder wall thickening, or biliary dilatation. Pancreas: Unremarkable. No pancreatic ductal dilatation or surrounding inflammatory changes. Spleen: Normal in size without focal abnormality. Adrenals/Urinary Tract: Adrenal glands are unremarkable. Kidneys are normal, without renal calculi, focal lesion, or hydronephrosis. Mild diffuse thickening of the urinary bladder wall. Stomach/Bowel: Stomach is within normal limits. Appendix appears normal. No evidence of bowel wall thickening, distention, or inflammatory changes. Vascular/Lymphatic: Aortic atherosclerosis. No enlarged abdominal or pelvic lymph nodes. Reproductive: Uterus and bilateral adnexa are unremarkable. Other: Small  fat containing periumbilical hernia. No abdominopelvic ascites. Musculoskeletal: No acute or significant osseous findings. IMPRESSION: 1. No evidence of acute abnormality within the solid abdominal organs. 2. Mild diffuse thickening of the urinary bladder wall. Please correlate to urinalysis. 3. Calcific atherosclerotic disease of the coronary arteries and aorta. 4. Moderate hiatal hernia. Electronically Signed   By: Ted Mcalpine M.D.   On: 12/28/2018 14:39   Dg Chest Port 1 View  Result Date: 12/28/2018 CLINICAL DATA:  Hyperglycemia EXAM: PORTABLE CHEST 1 VIEW COMPARISON:  May 08, 2005 FINDINGS: There is no appreciable edema or consolidation. Heart size and pulmonary vascularity are  normal. No adenopathy. No bone lesions. IMPRESSION: No edema or consolidation. Electronically Signed   By: Bretta Bang III M.D.   On: 12/28/2018 11:11   [place] Ekg Site Rite  Result Date: 12/28/2018 If Site Rite image not attached, placement could not be confirmed due to current cardiac rhythm.    Discharge Exam: Vitals:   12/29/18 2228 12/30/18 0535  BP: 129/65 (!) 146/76  Pulse: 91 84  Resp:    Temp: 98.5 F (36.9 C) 98.4 F (36.9 C)  SpO2: 99% 100%   Vitals:   12/29/18 0800 12/29/18 1000 12/29/18 2228 12/30/18 0535  BP: 129/77 128/70 129/65 (!) 146/76  Pulse:   91 84  Resp: 17 17    Temp: 99.4 F (37.4 C)  98.5 F (36.9 C) 98.4 F (36.9 C)  TempSrc: Axillary  Axillary Axillary  SpO2:   99% 100%  Weight:    90.8 kg  Height:        General: Pt is alert, awake, not in acute distress Cardiovascular: RRR, S1/S2 +, no rubs, no gallops Respiratory: CTA bilaterally, no wheezing, no rhonchi Abdominal: Soft, NT, ND, bowel sounds + Extremities: no edema, no cyanosis    The results of significant diagnostics from this hospitalization (including imaging, microbiology, ancillary and laboratory) are listed below for reference.     Microbiology: Recent Results (from the past 240 hour(s))  Urine Culture     Status: Abnormal   Collection Time: 12/28/18  5:06 PM  Result Value Ref Range Status   Specimen Description   Final    URINE, CLEAN CATCH Performed at [facility], [address], [place], [place] [address]    Special Requests   Final    NONE Performed at [facility], [address], [place], [place] [address]    Culture (A)  Final    <10,000 COLONIES/mL INSIGNIFICANT GROWTH Performed at [facility], [address], [place], [place] [address]    Report Status 12/29/2018 FINAL  Final  Blood Culture (routine x 2)     Status: None (Preliminary result)   Collection Time: 12/28/18  6:50 PM  Result Value Ref Range Status   Specimen Description PORTA CATH   Final   Special Requests   Final    BOTTLES DRAWN AEROBIC AND ANAEROBIC Blood Culture adequate volume   Culture   Final    NO GROWTH 2 DAYS Performed at [facility], [address], [place], [place] [address]    Report Status PENDING  Incomplete  MRSA PCR Screening     Status: None   Collection Time: 12/28/18  7:08 PM  Result Value Ref Range Status   MRSA by PCR NEGATIVE NEGATIVE Final    Comment:        The GeneXpert MRSA Assay (FDA approved for NASAL specimens only), is one component of a comprehensive MRSA colonization surveillance program.  It is not intended to diagnose MRSA infection nor to guide or monitor treatment for MRSA infections. Performed at [facility], [address] [place], [place] [address]      Labs: BNP (last 3 results) No results for input(s): BNP in the last 8760 hours. Basic Metabolic Panel: Recent Labs  Lab 12/28/18 1202 12/29/18 0452 12/30/18 0500  NA 143 145 144  K 4.8 3.9 3.4*  CL 108 119* 115*  CO2 7* 17* 19*  GLUCOSE 294* 97 260*  BUN 51* 35* 18  CREATININE 2.46* 1.27* 0.90  CALCIUM 9.9 8.7* 8.7*   Liver Function Tests: Recent Labs  Lab 12/28/18 1202 12/29/18 0452 12/30/18 0500  AST 21 11* 13*  ALT ALKPHOS 146* 102 95  BILITOT 1.4* 0.8 0.6  PROT 8.2* 6.1* 5.9*  ALBUMIN 4.2 3.1* 3.0*   No results for input(s): LIPASE, AMYLASE in the last 168 hours. No results for input(s): AMMONIA in the last 168 hours. CBC: Recent Labs  Lab 12/28/18 1202 12/29/18 0452 12/30/18 0500  WBC 18.9* 14.8* 9.2  NEUTROABS 16.0*  --   --   HGB 12.7 10.2* 10.6*  HCT 43.1 31.9* 33.9*  MCV 92.1 85.3 86.0  PLT 409* 342 320   Cardiac Enzymes: No results for input(s): CKTOTAL, CKMB, CKMBINDEX, TROPONINI in the last 168 hours. BNP: Invalid input(s): POCBNP CBG: Recent Labs  Lab 12/28/18 2035 12/29/18 0807 12/29/18 1048 12/29/18 1646 12/29/18 2226  GLUCAP 455* 93 148* 179* 133*   D-Dimer No results for input(s): DDIMER in  the last 72 hours. Hgb A1c No results for input(s): HGBA1C in the last 72 hours. Lipid Profile No results for input(s): CHOL, HDL, LDLCALC, TRIG, CHOLHDL, LDLDIRECT in the last 72 hours. Thyroid function studies No results for input(s): TSH, T4TOTAL, T3FREE, THYROIDAB in the last 72 hours.  Invalid input(s): FREET3 Anemia work up No results for input(s): VITAMINB12, FOLATE, FERRITIN, TIBC, IRON, RETICCTPCT in the last 72 hours. Urinalysis    Component Value Date/Time   COLORURINE YELLOW 12/28/2018 1706   APPEARANCEUR TURBID (A) 12/28/2018 1706   LABSPEC 1.010 12/28/2018 1706   PHURINE 5.0 12/28/2018 1706   GLUCOSEU >=500 (A) 12/28/2018 1706   HGBUR MODERATE (A) 12/28/2018 1706   BILIRUBINUR NEGATIVE 12/28/2018 1706   KETONESUR 20 (A) 12/28/2018 1706   PROTEINUR 30 (A) 12/28/2018 1706   NITRITE NEGATIVE 12/28/2018 1706   LEUKOCYTESUR LARGE (A) 12/28/2018 1706   Sepsis Labs Invalid input(s): PROCALCITONIN,  WBC,  LACTICIDVEN Microbiology Recent Results (from the past 240 hour(s))  Urine Culture     Status: Abnormal   Collection Time: 12/28/18  5:06 PM  Result Value Ref Range Status   Specimen Description   Final    URINE, CLEAN CATCH Performed at [facility], [address], [place], [place] [address]    Special Requests   Final    NONE Performed at [facility], [address] [place], [place] [address]    Culture (A)  Final    <10,000 COLONIES/mL INSIGNIFICANT GROWTH Performed at [facility], [address] [place], [place] [address]    Report Status 12/29/2018 FINAL  Final  Blood Culture (routine x 2)     Status: None (Preliminary result)   Collection Time: 12/28/18  6:50 PM  Result Value Ref Range Status   Specimen Description PORTA CATH  Final   Special Requests   Final    BOTTLES DRAWN AEROBIC AND ANAEROBIC Blood Culture adequate volume   Culture  Final    NO GROWTH 2 DAYS Performed at [facility], [address] [place], [place] [address]     Report Status PENDING  Incomplete  MRSA PCR Screening     Status: None   Collection Time: 12/28/18  7:08 PM  Result Value Ref Range Status   MRSA by PCR NEGATIVE NEGATIVE Final    Comment:        The GeneXpert MRSA Assay (FDA approved for NASAL specimens only), is one component of a comprehensive MRSA colonization surveillance program. It is not intended to diagnose MRSA infection nor to guide or monitor treatment for MRSA infections. Performed at [facility], [address], [place], [place] [address]      Time coordinating discharge: 35 minutes  SIGNED:   Erick Blinks, DO [facility] 12/30/2018, 7:19 AM  If 7PM-7AM, please contact night-coverage [email or web address] Password [email or web address]

## 2018-12-30 NOTE — Progress Notes (Signed)
PICC line removed without complications. vaseline and gauze in place with a tegaderm dressing.

## 2018-12-30 NOTE — Progress Notes (Signed)
Morning medications given to pt as well as breakfast. Explained discharge instructions to pt and called her legal Guardian, Oscar La, to update and give discharge instructions. Also spoke with Marchelle Folks, Social worker, at [facility].

## 2018-12-30 NOTE — TOC Transition Note (Signed)
Transition of Care [facility]) - CM/SW Discharge Note   Patient Details  Name: [name] MRN: [id number] Date of Birth: 26-Sep-1971  [facility] CM/SW Contact:  Elliot Gault, LCSW Phone Number: 12/30/2018, 9:29 AM   Clinical Narrative:   Pt with orders for dc back to SNF today. Spoke with pt's RN, Gentry Fitz, who has already updated pt's guardian and Kayla at [facility], and arranged transport. DC clinical sent electronically. There are no other TOC needs for dc.    Final next level of care: Long Term Nursing Home Barriers to Discharge: No Barriers Identified   Patient Goals and CMS Choice        Discharge Placement                       Discharge Plan and Services                          Social Determinants of Health (SDOH) Interventions     Readmission Risk Interventions No flowsheet data found.

## 2019-01-02 LAB — CULTURE, BLOOD (ROUTINE X 2)
Culture: NO GROWTH
Special Requests: ADEQUATE

## 2019-07-07 DEATH — deceased

## 2020-03-10 IMAGING — CT CT ABDOMEN AND PELVIS WITHOUT CONTRAST
2 of 7 series · 14 of 46 positions shown, 18 images · non-contrast
Comparison: None.

CLINICAL DATA: Hyperglycemia.

EXAM:
CT ABDOMEN AND PELVIS WITHOUT CONTRAST
TECHNIQUE: Multidetector CT imaging of the abdomen and pelvis was performed
following the standard protocol without IV contrast.

[Series 2: axial st · axial · 0.74mm/px · z∈[+767,+1192]mm · 11 of 99 slices shown, 15 images]
[im 9/99  soft-tissue]
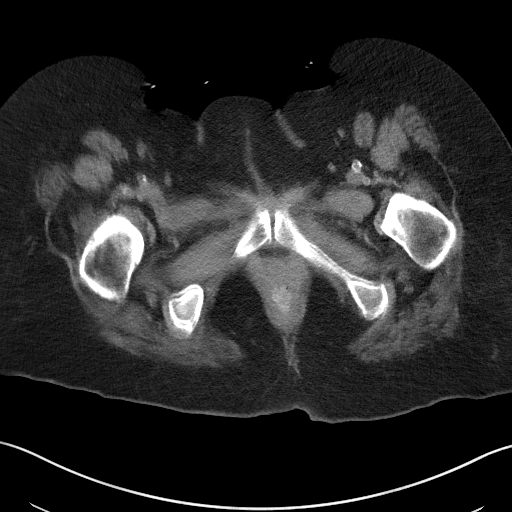
[im 9/99  bone]
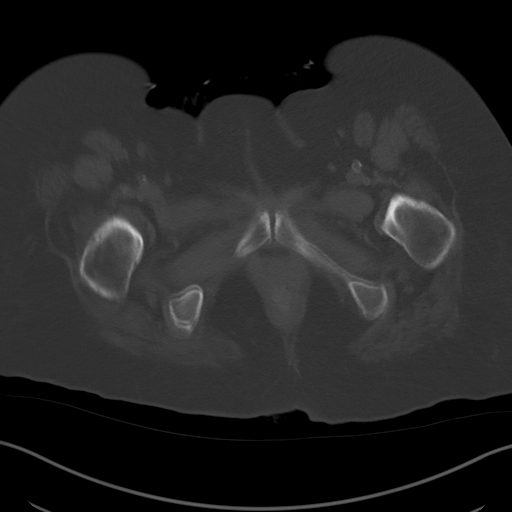
[im 18/99  soft-tissue]
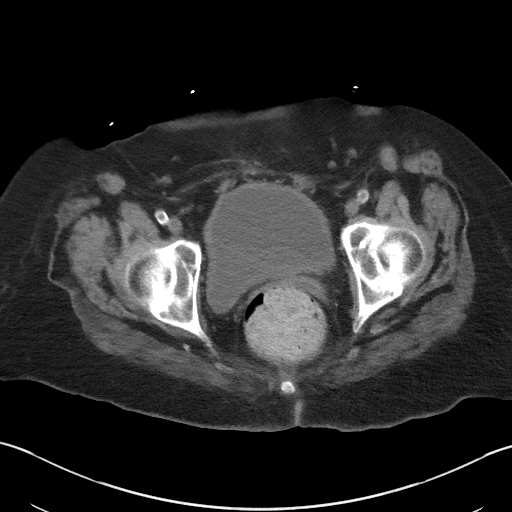
[im 30/99  soft-tissue]
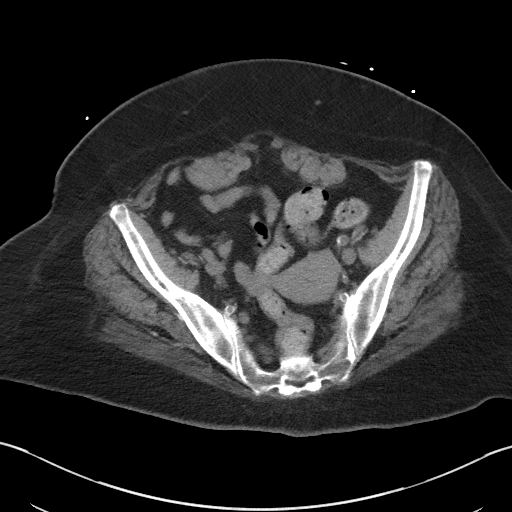
[im 39/99  soft-tissue]
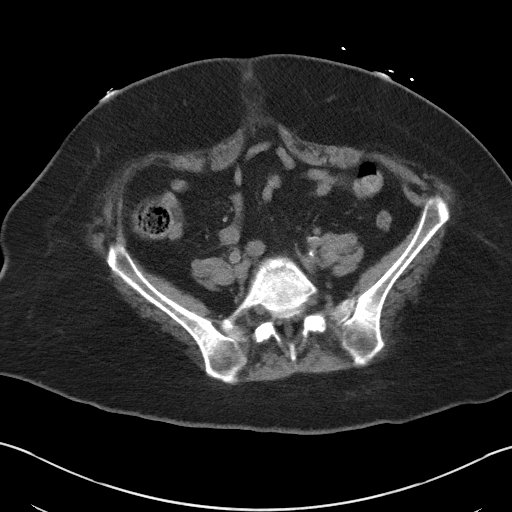
[im 52/99  soft-tissue]
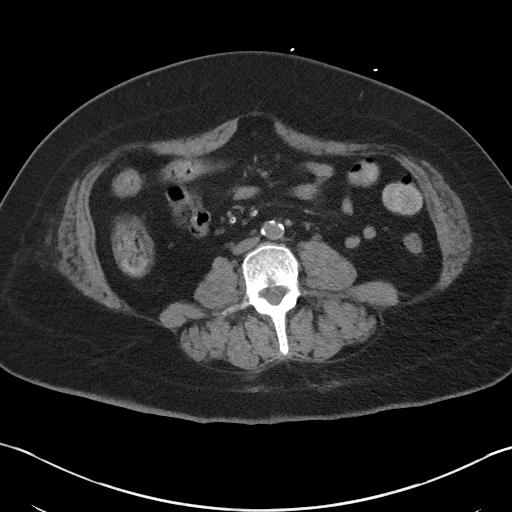
[im 60/99  soft-tissue]
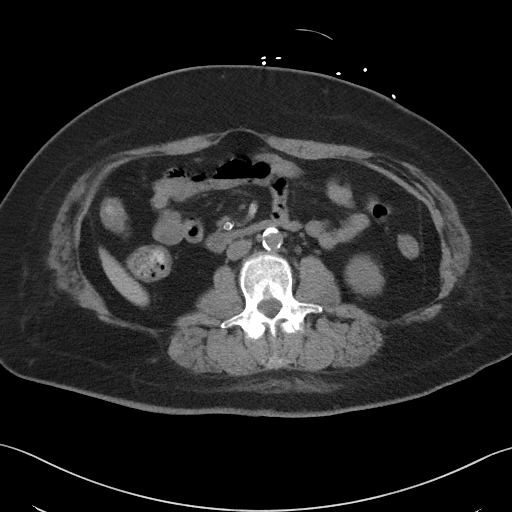
[im 69/99  soft-tissue]
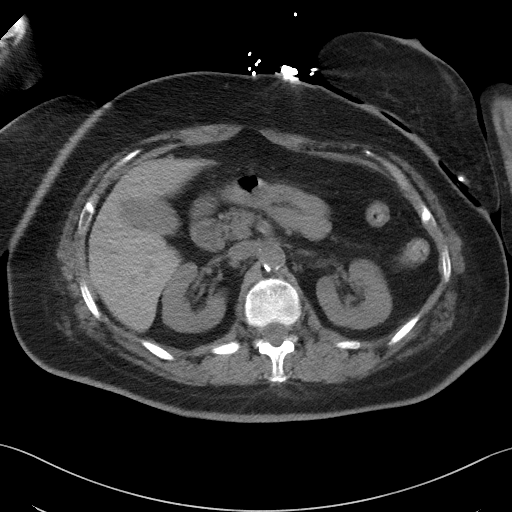
[im 81/99  soft-tissue]
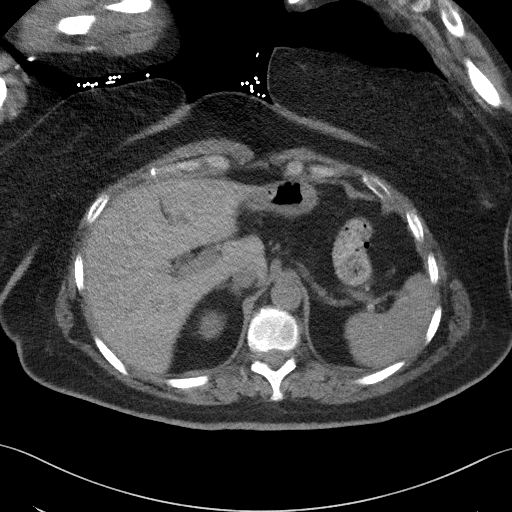
[im 81/99  lung]
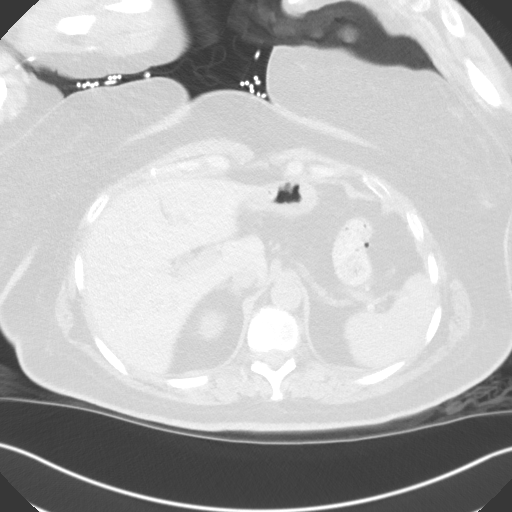
[im 86/99  lung]
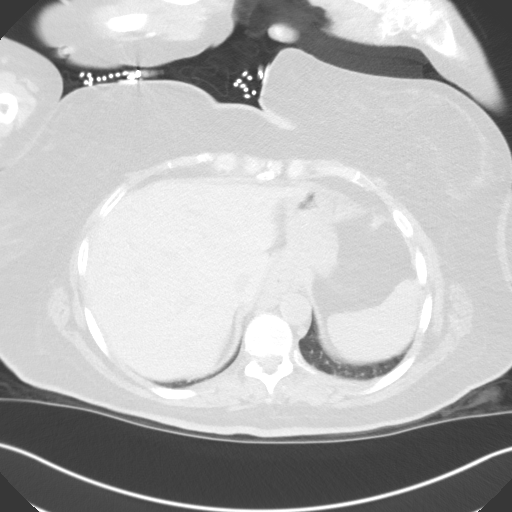
[im 90/99  soft-tissue]
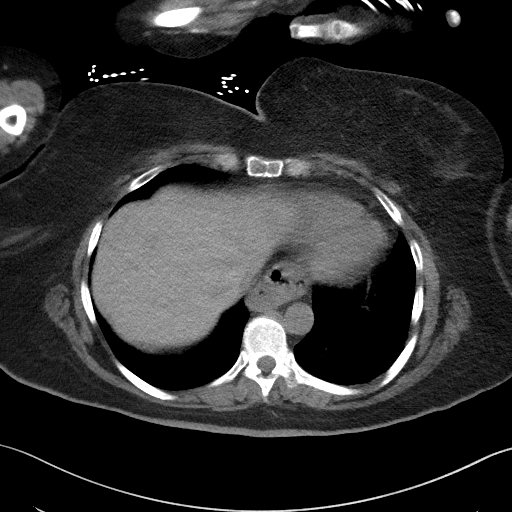
[im 90/99  lung]
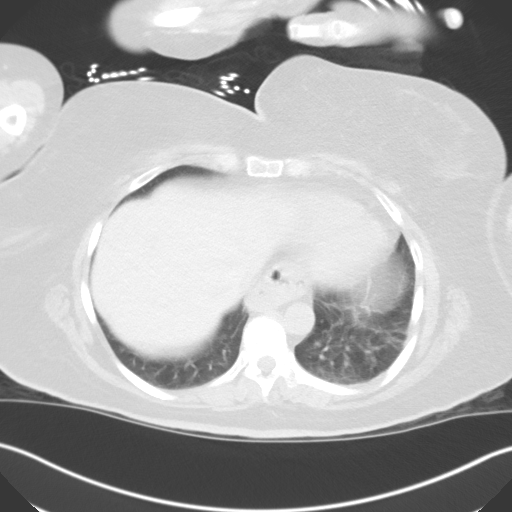
[im 90/99  bone]
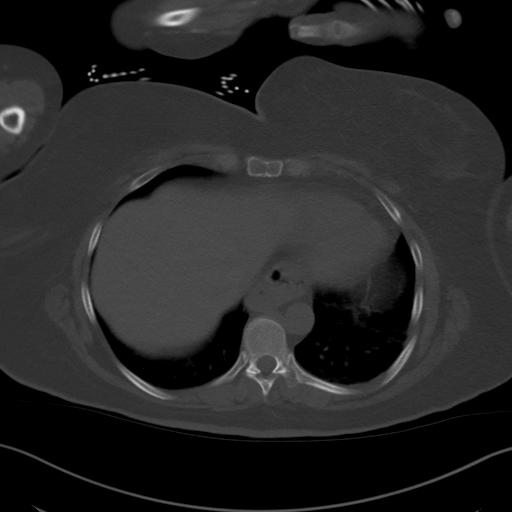
[im 94/99  lung]
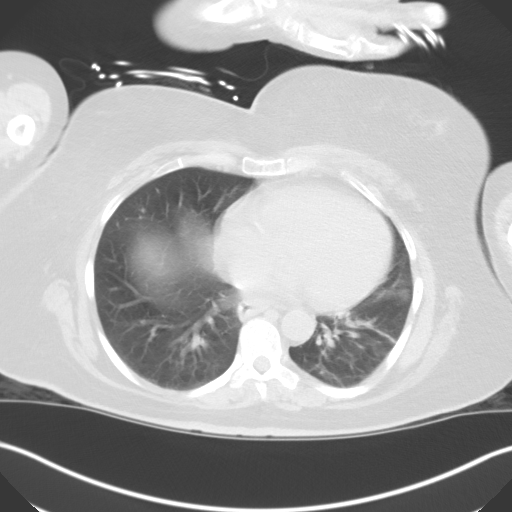

[Series 7: coronal st · coronal · 0.71mm/px · 3 of 87 slices shown]
[im 22/87  soft-tissue]
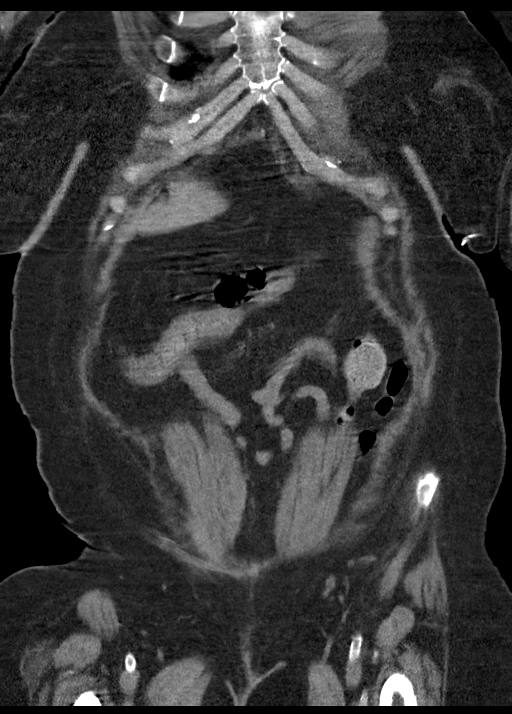
[im 44/87  soft-tissue]
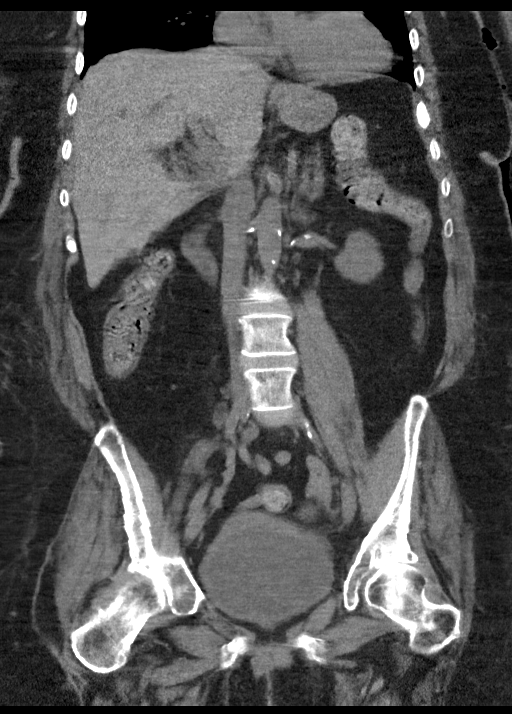
[im 65/87  soft-tissue]
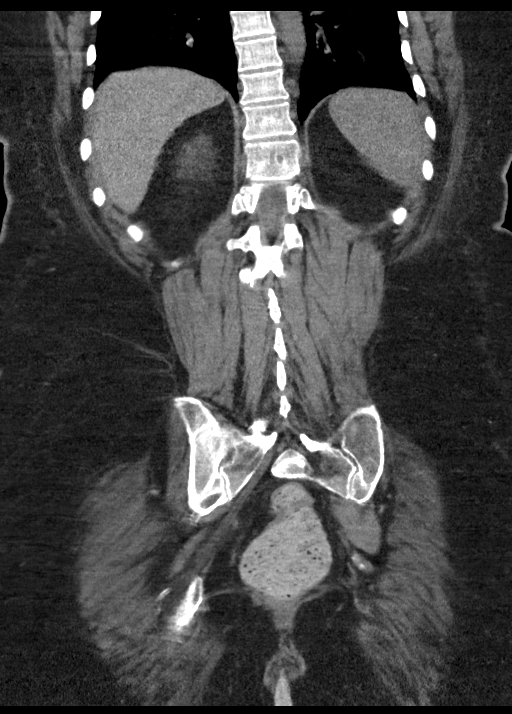

[14 of 46 positions shown; findings below may reference images not displayed]

FINDINGS: Lower chest: Calcific atherosclerotic disease of the coronary
arteries. Moderate hiatal hernia.

Hepatobiliary: No focal liver abnormality is seen. No gallstones,
gallbladder wall thickening, or biliary dilatation.

Pancreas: Unremarkable. No pancreatic ductal dilatation or
surrounding inflammatory changes.

Spleen: Normal in size without focal abnormality.

Adrenals/Urinary Tract: Adrenal glands are unremarkable. Kidneys are
normal, without renal calculi, focal lesion, or hydronephrosis. Mild
diffuse thickening of the urinary bladder wall.

Stomach/Bowel: Stomach is within normal limits. Appendix appears
normal. No evidence of bowel wall thickening, distention, or
inflammatory changes.

Vascular/Lymphatic: Aortic atherosclerosis. No enlarged abdominal or
pelvic lymph nodes.

Reproductive: Uterus and bilateral adnexa are unremarkable.

Other: Small fat containing periumbilical hernia. No abdominopelvic
ascites.

Musculoskeletal: No acute or significant osseous findings.
IMPRESSION: 1. No evidence of acute abnormality within the solid abdominal
organs.
2. Mild diffuse thickening of the urinary bladder wall. Please
correlate to urinalysis.
3. Calcific atherosclerotic disease of the coronary arteries and
aorta.
4. Moderate hiatal hernia.
